# Patient Record
Sex: Female | Born: 1995
Health system: Southern US, Community
[De-identification: ages and names within clinical notes are randomized; demographics above are authoritative.]

## PROBLEM LIST (undated history)

## (undated) DIAGNOSIS — J020 Streptococcal pharyngitis: Secondary | ICD-10-CM

## (undated) DIAGNOSIS — F32A Depression, unspecified: Secondary | ICD-10-CM

## (undated) DIAGNOSIS — R600 Localized edema: Secondary | ICD-10-CM

## (undated) DIAGNOSIS — E669 Obesity, unspecified: Secondary | ICD-10-CM

## (undated) DIAGNOSIS — R1310 Dysphagia, unspecified: Secondary | ICD-10-CM

## (undated) DIAGNOSIS — E559 Vitamin D deficiency, unspecified: Secondary | ICD-10-CM

## (undated) DIAGNOSIS — K5900 Constipation, unspecified: Secondary | ICD-10-CM

## (undated) DIAGNOSIS — G4730 Sleep apnea, unspecified: Secondary | ICD-10-CM

## (undated) DIAGNOSIS — F419 Anxiety disorder, unspecified: Secondary | ICD-10-CM

## (undated) DIAGNOSIS — K589 Irritable bowel syndrome without diarrhea: Secondary | ICD-10-CM

## (undated) DIAGNOSIS — R0602 Shortness of breath: Secondary | ICD-10-CM

## (undated) DIAGNOSIS — R030 Elevated blood-pressure reading, without diagnosis of hypertension: Secondary | ICD-10-CM

## (undated) DIAGNOSIS — K219 Gastro-esophageal reflux disease without esophagitis: Secondary | ICD-10-CM

## (undated) HISTORY — DX: Localized edema: R60.0

## (undated) HISTORY — DX: Irritable bowel syndrome, unspecified: K58.9

## (undated) HISTORY — DX: Constipation, unspecified: K59.00

## (undated) HISTORY — DX: Dysphagia, unspecified: R13.10

## (undated) HISTORY — DX: Sleep apnea, unspecified: G47.30

## (undated) HISTORY — PX: UMBILICAL HERNIA REPAIR: SHX196

## (undated) HISTORY — DX: Shortness of breath: R06.02

## (undated) HISTORY — DX: Obesity, unspecified: E66.9

## (undated) HISTORY — DX: Gastro-esophageal reflux disease without esophagitis: K21.9

## (undated) HISTORY — DX: Depression, unspecified: F32.A

## (undated) HISTORY — DX: Elevated blood-pressure reading, without diagnosis of hypertension: R03.0

## (undated) HISTORY — DX: Anxiety disorder, unspecified: F41.9

## (undated) HISTORY — DX: Vitamin D deficiency, unspecified: E55.9

---

## 2016-12-17 DIAGNOSIS — Z01419 Encounter for gynecological examination (general) (routine) without abnormal findings: Secondary | ICD-10-CM | POA: Diagnosis not present

## 2016-12-17 DIAGNOSIS — Z113 Encounter for screening for infections with a predominantly sexual mode of transmission: Secondary | ICD-10-CM | POA: Diagnosis not present

## 2016-12-17 DIAGNOSIS — Z3202 Encounter for pregnancy test, result negative: Secondary | ICD-10-CM | POA: Diagnosis not present

## 2016-12-17 DIAGNOSIS — Z30017 Encounter for initial prescription of implantable subdermal contraceptive: Secondary | ICD-10-CM | POA: Diagnosis not present

## 2017-12-15 DIAGNOSIS — N760 Acute vaginitis: Secondary | ICD-10-CM | POA: Diagnosis not present

## 2017-12-15 DIAGNOSIS — Z113 Encounter for screening for infections with a predominantly sexual mode of transmission: Secondary | ICD-10-CM | POA: Diagnosis not present

## 2018-02-21 DIAGNOSIS — Z23 Encounter for immunization: Secondary | ICD-10-CM | POA: Diagnosis not present

## 2018-05-10 DIAGNOSIS — J019 Acute sinusitis, unspecified: Secondary | ICD-10-CM | POA: Diagnosis not present

## 2018-06-03 DIAGNOSIS — H6992 Unspecified Eustachian tube disorder, left ear: Secondary | ICD-10-CM | POA: Diagnosis not present

## 2019-06-22 ENCOUNTER — Other Ambulatory Visit: Payer: Self-pay

## 2019-06-22 DIAGNOSIS — Z20822 Contact with and (suspected) exposure to covid-19: Secondary | ICD-10-CM

## 2019-06-23 ENCOUNTER — Other Ambulatory Visit: Payer: Self-pay

## 2019-06-23 DIAGNOSIS — Z20822 Contact with and (suspected) exposure to covid-19: Secondary | ICD-10-CM

## 2019-06-24 LAB — NOVEL CORONAVIRUS, NAA
SARS-CoV-2, NAA: NOT DETECTED
SARS-CoV-2, NAA: NOT DETECTED

## 2019-07-10 ENCOUNTER — Other Ambulatory Visit: Payer: Self-pay

## 2019-07-10 DIAGNOSIS — Z20828 Contact with and (suspected) exposure to other viral communicable diseases: Secondary | ICD-10-CM | POA: Diagnosis not present

## 2019-07-10 DIAGNOSIS — Z20822 Contact with and (suspected) exposure to covid-19: Secondary | ICD-10-CM

## 2019-07-11 LAB — NOVEL CORONAVIRUS, NAA: SARS-CoV-2, NAA: NOT DETECTED

## 2019-07-20 ENCOUNTER — Other Ambulatory Visit: Payer: Self-pay

## 2019-07-20 DIAGNOSIS — Z20822 Contact with and (suspected) exposure to covid-19: Secondary | ICD-10-CM

## 2019-07-20 DIAGNOSIS — Z20828 Contact with and (suspected) exposure to other viral communicable diseases: Secondary | ICD-10-CM | POA: Diagnosis not present

## 2019-07-24 LAB — NOVEL CORONAVIRUS, NAA: SARS-CoV-2, NAA: DETECTED — AB

## 2019-07-29 DIAGNOSIS — U071 COVID-19: Secondary | ICD-10-CM | POA: Diagnosis not present

## 2019-07-29 DIAGNOSIS — Z20828 Contact with and (suspected) exposure to other viral communicable diseases: Secondary | ICD-10-CM | POA: Diagnosis not present

## 2019-07-31 DIAGNOSIS — Z20828 Contact with and (suspected) exposure to other viral communicable diseases: Secondary | ICD-10-CM | POA: Diagnosis not present

## 2019-08-16 ENCOUNTER — Other Ambulatory Visit: Payer: Self-pay

## 2019-08-16 ENCOUNTER — Ambulatory Visit (HOSPITAL_COMMUNITY)
Admission: EM | Admit: 2019-08-16 | Discharge: 2019-08-16 | Disposition: A | Discharge status: Home / Self Care | Payer: BLUE CROSS/BLUE SHIELD | Attending: Urgent Care | Admitting: Urgent Care

## 2019-08-16 ENCOUNTER — Encounter (HOSPITAL_COMMUNITY): Payer: Self-pay

## 2019-08-16 DIAGNOSIS — R0789 Other chest pain: Secondary | ICD-10-CM

## 2019-08-16 DIAGNOSIS — R0602 Shortness of breath: Secondary | ICD-10-CM | POA: Diagnosis not present

## 2019-08-16 DIAGNOSIS — R000 Tachycardia, unspecified: Secondary | ICD-10-CM | POA: Diagnosis not present

## 2019-08-16 DIAGNOSIS — Z8616 Personal history of COVID-19: Secondary | ICD-10-CM

## 2019-08-16 DIAGNOSIS — Z8619 Personal history of other infectious and parasitic diseases: Secondary | ICD-10-CM

## 2019-08-16 DIAGNOSIS — R059 Cough, unspecified: Secondary | ICD-10-CM

## 2019-08-16 DIAGNOSIS — R05 Cough: Secondary | ICD-10-CM

## 2019-08-16 LAB — D-DIMER, QUANTITATIVE: D-Dimer, Quant: 0.61 ug/mL-FEU — ABNORMAL HIGH (ref 0.00–0.50)

## 2019-08-16 MED ORDER — PROMETHAZINE-DM 6.25-15 MG/5ML PO SYRP: 5.0000 mL | ORAL_SOLUTION | Freq: Every evening | ORAL | 0 refills | Status: DC | PRN

## 2019-08-16 MED ORDER — BENZONATATE 100 MG PO CAPS: 100.0000 mg | ORAL_CAPSULE | Freq: Three times a day (TID) | ORAL | 0 refills | Status: DC | PRN

## 2019-08-16 MED ORDER — ALBUTEROL SULFATE HFA 108 (90 BASE) MCG/ACT IN AERS: 1.0000 | INHALATION_SPRAY | Freq: Four times a day (QID) | RESPIRATORY_TRACT | 0 refills | Status: DC | PRN

## 2019-08-16 NOTE — ED Provider Notes (Signed)
[place]  MRN: [id number] DOB: [birth year]  Subjective:   Rachel Dyer is a 23 y.o. female presenting for 1 week history of dyspnea, shortness of breath, heart racing.  Patient has also been having a productive cough, mild chest heaviness worse when she lies down.  She tested positive for COVID-19 on 07/21/2019 and has since completed her quarantine.  She is not currently taking any medications and has no known food or drug allergies.  Denies past medical and surgical history.   Family History  Problem Relation Age of Onset  . Healthy Mother   . Hypertension Father     Social History   Tobacco Use  . Smoking status: Never Smoker  . Smokeless tobacco: Never Used  Substance Use Topics  . Alcohol use: Never  . Drug use: Never    Review of Systems  Constitutional: Positive for malaise/fatigue. Negative for fever.  HENT: Negative for congestion, ear pain, sinus pain and sore throat.   Eyes: Negative for discharge and redness.  Respiratory: Positive for cough and shortness of breath. Negative for hemoptysis and wheezing.   Cardiovascular: Positive for palpitations. Negative for chest pain.  Gastrointestinal: Negative for abdominal pain, diarrhea, nausea and vomiting.  Genitourinary: Negative for dysuria, flank pain and hematuria.  Musculoskeletal: Negative for myalgias.  Skin: Negative for rash.  Neurological: Negative for dizziness, weakness and headaches.  Psychiatric/Behavioral: Negative for depression and substance abuse.     Objective:   Vitals: BP 128/83 (BP Location: Right Arm)   Pulse 100   Temp 98.5 F (36.9 C) (Oral)   Resp 16   LMP 08/01/2019   SpO2 100%   Physical Exam Constitutional:      General: She is not in acute distress.    Appearance: Normal appearance. She is well-developed. She is not ill-appearing, toxic-appearing or diaphoretic.  HENT:     Head: Normocephalic and atraumatic.     Nose: Nose normal.     Mouth/Throat:   Mouth: Mucous membranes are moist.  Eyes:     Extraocular Movements: Extraocular movements intact.     Pupils: Pupils are equal, round, and reactive to light.  Cardiovascular:     Rate and Rhythm: Normal rate and regular rhythm.     Pulses: Normal pulses.     Heart sounds: Normal heart sounds. No murmur. No friction rub. No gallop.   Pulmonary:     Effort: Pulmonary effort is normal. No respiratory distress.     Breath sounds: Normal breath sounds. No stridor. No wheezing, rhonchi or rales.  Skin:    General: Skin is warm and dry.     Findings: No rash.  Neurological:     Mental Status: She is alert and oriented to person, place, and time.     Cranial Nerves: No cranial nerve deficit.     Motor: No weakness.  Psychiatric:        Mood and Affect: Mood normal. Mood is not anxious.        Behavior: Behavior normal. Behavior is not agitated.        Thought Content: Thought content normal.     ED ECG REPORT   Date: 08/16/2019  Rate: 82bpm  Rhythm: normal sinus rhythm  QRS Axis: normal  Intervals: normal  ST/T Wave abnormalities: nonspecific T wave changes  Conduction Disutrbances:none  Narrative Interpretation: Sinus rhythm at 82bpm with T-wave inversion in V2-V3.  Old EKG Reviewed: none available  I have personally reviewed the EKG tracing and agree with the  computerized printout as noted.   Assessment and Plan :   1. Shortness of breath   2. History of 2019 novel coronavirus disease (COVID-19)   3. Racing heart beat   4. Chest discomfort   5. Cough     DDimer pending, will use supportive care for patient's shortness of breath. Offered symptomatic relief with albuterol inhaler, cough suppression medications. Counseled patient on potential for adverse effects with medications prescribed/recommended today, ER and return-to-clinic precautions discussed, patient verbalized understanding.     Wallis Bamberg, [place] 08/16/19 1921

## 2019-08-16 NOTE — ED Triage Notes (Signed)
Patient presents to Urgent Care with complaints SOB predominantly on exertion with subjective feeling of "speedy heart rate"of since 08/10/19. Coughing up sputum. Feeling of mild chest heaviness when lying down. Denies any fever, chills,  or abd pain, n/v/d.  No acute/obvious resp distress; regular resp rate/pattern.   Patient reports  Covid+  test 07/21/19 and completed 10 day quarantine.

## 2019-08-17 ENCOUNTER — Other Ambulatory Visit: Payer: Self-pay

## 2019-08-17 ENCOUNTER — Telehealth (HOSPITAL_COMMUNITY): Payer: Self-pay | Admitting: Emergency Medicine

## 2019-08-17 ENCOUNTER — Encounter (HOSPITAL_COMMUNITY): Payer: Self-pay | Admitting: *Deleted

## 2019-08-17 ENCOUNTER — Emergency Department (HOSPITAL_COMMUNITY): Payer: BLUE CROSS/BLUE SHIELD

## 2019-08-17 ENCOUNTER — Emergency Department (HOSPITAL_COMMUNITY)
Admission: EM | Admit: 2019-08-17 | Discharge: 2019-08-18 | Disposition: A | Discharge status: Home / Self Care | Payer: BLUE CROSS/BLUE SHIELD | Attending: Emergency Medicine | Admitting: Emergency Medicine

## 2019-08-17 DIAGNOSIS — R778 Other specified abnormalities of plasma proteins: Secondary | ICD-10-CM | POA: Diagnosis not present

## 2019-08-17 DIAGNOSIS — R0602 Shortness of breath: Secondary | ICD-10-CM | POA: Insufficient documentation

## 2019-08-17 DIAGNOSIS — U071 COVID-19: Secondary | ICD-10-CM | POA: Diagnosis not present

## 2019-08-17 DIAGNOSIS — R002 Palpitations: Secondary | ICD-10-CM | POA: Diagnosis not present

## 2019-08-17 DIAGNOSIS — R7989 Other specified abnormal findings of blood chemistry: Secondary | ICD-10-CM

## 2019-08-17 LAB — CBC WITH DIFFERENTIAL/PLATELET
Abs Immature Granulocytes: 0.02 10*3/uL (ref 0.00–0.07)
Basophils Absolute: 0 10*3/uL (ref 0.0–0.1)
Basophils Relative: 0 %
Eosinophils Absolute: 0.2 10*3/uL (ref 0.0–0.5)
Eosinophils Relative: 4 %
HCT: 36.4 % (ref 36.0–46.0)
Hemoglobin: 11.1 g/dL — ABNORMAL LOW (ref 12.0–15.0)
Immature Granulocytes: 0 %
Lymphocytes Relative: 47 %
Lymphs Abs: 2.9 10*3/uL (ref 0.7–4.0)
MCH: 25.5 pg — ABNORMAL LOW (ref 26.0–34.0)
MCHC: 30.5 g/dL (ref 30.0–36.0)
MCV: 83.5 fL (ref 80.0–100.0)
Monocytes Absolute: 0.4 10*3/uL (ref 0.1–1.0)
Monocytes Relative: 6 %
Neutro Abs: 2.7 10*3/uL (ref 1.7–7.7)
Neutrophils Relative %: 43 %
Platelets: 396 10*3/uL (ref 150–400)
RBC: 4.36 MIL/uL (ref 3.87–5.11)
RDW: 15.7 % — ABNORMAL HIGH (ref 11.5–15.5)
WBC: 6.2 10*3/uL (ref 4.0–10.5)
nRBC: 0 % (ref 0.0–0.2)

## 2019-08-17 LAB — I-STAT CHEM 8, ED
BUN: 8 mg/dL (ref 6–20)
Calcium, Ion: 1.22 mmol/L (ref 1.15–1.40)
Chloride: 103 mmol/L (ref 98–111)
Creatinine, Ser: 0.8 mg/dL (ref 0.44–1.00)
Glucose, Bld: 98 mg/dL (ref 70–99)
HCT: 36 % (ref 36.0–46.0)
Hemoglobin: 12.2 g/dL (ref 12.0–15.0)
Potassium: 3.8 mmol/L (ref 3.5–5.1)
Sodium: 139 mmol/L (ref 135–145)
TCO2: 27 mmol/L (ref 22–32)

## 2019-08-17 MED ORDER — IOHEXOL 350 MG/ML SOLN
100.0000 mL | Freq: Once | INTRAVENOUS | Status: AC | PRN
  Administered 2019-08-18: 100 mL via INTRAVENOUS

## 2019-08-17 NOTE — Telephone Encounter (Signed)
Reviewed labs with Dr. Meda Coffee, per Dr. Meda Coffee, safest for patient is to rule out blood clot in the emergency department. Pt contacted and made aware of results and need to be seen in the ER. Pt verbalized understanding, all questions answered.

## 2019-08-17 NOTE — ED Provider Notes (Signed)
[facility]-EMERGENCY DEPT Provider Note   CSN: [id number] Arrival date & time: 08/17/19  1817     History Chief Complaint  Patient presents with  . Abnormal Lab    Rachel Dyer is a 23 y.o. female.  The history is provided by the patient and medical records.  Abnormal Lab   23 year old female presenting to the ED after abnormal labs from urgent care.  She tested positive for COVID-19 on 07/21/2019 and completed her quarantine.  States she is feeling better from a infectious standpoint, still has some intermittent phlegm that she coughs up.  States her biggest issue is that she is continued to feel short of breath with some intermittent palpitations.  She was seen at urgent care yesterday for same and had an elevated D-dimer so sent to the ED today for further evaluation.  She has not had any ongoing fevers, hemoptysis, nausea, vomiting, or diarrhea.  She denies any history of DVT or PE in the past.  She is not currently on any exogenous estrogens.  No recent travel.    History reviewed. No pertinent past medical history.  There are no problems to display for this patient.   History reviewed. No pertinent surgical history.   OB History   No obstetric history on file.     Family History  Problem Relation Age of Onset  . Healthy Mother   . Hypertension Father     Social History   Tobacco Use  . Smoking status: Never Smoker  . Smokeless tobacco: Never Used  Substance Use Topics  . Alcohol use: Never  . Drug use: Never    Home Medications Prior to Admission medications   Medication Sig Start Date End Date Taking? Authorizing Provider  albuterol (VENTOLIN HFA) 108 (90 Base) MCG/ACT inhaler Inhale 1-2 puffs into the lungs every 6 (six) hours as needed for wheezing or shortness of breath. 08/16/19  Yes Wallis Bamberg, PA-C  benzonatate (TESSALON) 100 MG capsule Take 1-2 capsules (100-200 mg total) by mouth 3 (three) times daily as needed. 08/16/19   Yes Wallis Bamberg, PA-C  promethazine-dextromethorphan (PROMETHAZINE-DM) 6.25-15 MG/5ML syrup Take 5 mLs by mouth at bedtime as needed for cough. 08/16/19  Yes Wallis Bamberg, PA-C    Allergies    Patient has no known allergies.  Review of Systems   Review of Systems  Respiratory: Positive for shortness of breath.   Cardiovascular: Positive for palpitations.  All other systems reviewed and are negative.   Physical Exam Updated Vital Signs BP 121/82   Pulse 75   Temp 99.3 F (37.4 C) (Oral)   Resp 18   Ht 5\' 5"  (1.651 m)   Wt 90.7 kg   LMP 08/01/2019   SpO2 100%   BMI 33.28 kg/m   Physical Exam Vitals and nursing note reviewed.  Constitutional:      Appearance: She is well-developed.     Comments: Non-toxic, appears well  HENT:     Head: Normocephalic and atraumatic.  Eyes:     Conjunctiva/sclera: Conjunctivae normal.     Pupils: Pupils are equal, round, and reactive to light.  Cardiovascular:     Rate and Rhythm: Normal rate and regular rhythm.     Heart sounds: Normal heart sounds.     Comments: HR 80's during exam Pulmonary:     Effort: Pulmonary effort is normal. No respiratory distress.     Breath sounds: Normal breath sounds. No stridor. No wheezing or rhonchi.     Comments:  Lungs clear, no distress, able to speak in full sentences, O2 sats 100%, no cough observed Abdominal:     General: Bowel sounds are normal.     Palpations: Abdomen is soft.     Hernia: No hernia is present.  Musculoskeletal:        General: Normal range of motion.     Cervical back: Normal range of motion.  Skin:    General: Skin is warm and dry.  Neurological:     Mental Status: She is alert and oriented to person, place, and time.     ED Results / Procedures / Treatments   Labs (all labs ordered are listed, but only abnormal results are displayed) Labs Reviewed  CBC WITH DIFFERENTIAL/PLATELET - Abnormal; Notable for the following components:      Result Value   Hemoglobin 11.1  (*)    MCH 25.5 (*)    RDW 15.7 (*)    All other components within normal limits  I-STAT CHEM 8, ED  I-STAT BETA HCG BLOOD, ED ([facility], [facility], [facility] ONLY)    EKG None  Radiology CT Angio Chest PE W and/or Wo Contrast  Result Date: 08/18/2019 CLINICAL DATA:  COVID positive, short of breath positive D-dimer EXAM: CT ANGIOGRAPHY CHEST WITH CONTRAST TECHNIQUE: Multidetector CT imaging of the chest was performed using the standard protocol during bolus administration of intravenous contrast. Multiplanar CT image reconstructions and MIPs were obtained to evaluate the vascular anatomy. CONTRAST:  100mL OMNIPAQUE IOHEXOL 350 MG/ML SOLN COMPARISON:  None. FINDINGS: Cardiovascular: Satisfactory opacification of the pulmonary arteries to the segmental level. No evidence of pulmonary embolism. Normal heart size. No pericardial effusion. Mediastinum/Nodes: No enlarged mediastinal, hilar, or axillary lymph nodes. Thyroid gland, trachea, and esophagus demonstrate no significant findings. Lungs/Pleura: Lungs are clear. No pleural effusion or pneumothorax. Upper Abdomen: No acute abnormality. Musculoskeletal: No chest wall abnormality. No acute or significant osseous findings. Review of the MIP images confirms the above findings. IMPRESSION: Negative.  No CT evidence for acute pulmonary embolus. Electronically Signed   By: Jasmine PangKim  Fujinaga M.D.   On: 08/18/2019 00:30    Procedures Procedures (including critical care time)  Medications Ordered in ED Medications  sodium chloride (PF) 0.9 % injection (  Canceled Entry 08/18/19 0057)  iohexol (OMNIPAQUE) 350 MG/ML injection 100 mL (100 mLs Intravenous Contrast Given 08/18/19 0009)    ED Course  I have reviewed the triage vital signs and the nursing notes.  Pertinent labs & imaging results that were available during my care of the patient were reviewed by me and considered in my medical decision making (see chart for details).    MDM Rules/Calculators/A&P  [age] year old  female here after elevated D-dimer at urgent care.  She was seen there due to some ongoing shortness of breath and palpitations.  Was diagnosed with Covid on 07/21/2019 but seems to have recovered well.  On my exam she is afebrile and nontoxic.  Her vitals are stable without any tachycardia or hypoxia.  Her lung sounds are clear.  Given elevated D-dimer, CTA was obtained which is negative for PE or other acute findings.  Results discussed with patient and she feels reassured.  Appears stable for discharge.  She can continue to follow-up with her primary care doctor as needed.  She may return here for any new or acute changes.  Final Clinical Impression(s) / ED Diagnoses Final diagnoses:  Elevated d-dimer    Rx / DC Orders ED Discharge Orders    None  Larene Pickett, PA-C 08/18/19 0121    Lucrezia Starch, MD 08/18/19 [phone]

## 2019-08-17 NOTE — ED Triage Notes (Signed)
Pt states she went to UC last night due to some SOB on exertion. (+) Covid beginning of Dec and still feels effects of it. Slightly elevated  D Dimer last night and was told to come to ED for follow up.

## 2019-08-18 DIAGNOSIS — R0602 Shortness of breath: Secondary | ICD-10-CM | POA: Diagnosis not present

## 2019-08-18 DIAGNOSIS — U071 COVID-19: Secondary | ICD-10-CM | POA: Diagnosis not present

## 2019-08-18 DIAGNOSIS — R7989 Other specified abnormal findings of blood chemistry: Secondary | ICD-10-CM | POA: Diagnosis not present

## 2019-08-18 LAB — I-STAT BETA HCG BLOOD, ED (MC, WL, AP ONLY): I-stat hCG, quantitative: <5 m[IU]/mL (ref <5)

## 2019-08-18 MED ORDER — SODIUM CHLORIDE (PF) 0.9 % IJ SOLN
INTRAMUSCULAR | Status: AC
  Filled 2019-08-18: qty 50

## 2019-08-18 NOTE — Discharge Instructions (Signed)
CT did not show any signs of blood clot or any other acute findings in the lungs. Follow up with your primary care doctor as needed. Return here for any new/acute changes or concerns.

## 2019-09-01 ENCOUNTER — Encounter (HOSPITAL_COMMUNITY): Payer: Self-pay

## 2019-09-01 ENCOUNTER — Ambulatory Visit (HOSPITAL_COMMUNITY)
Admission: EM | Admit: 2019-09-01 | Discharge: 2019-09-01 | Disposition: A | Discharge status: Home / Self Care | Payer: BC Managed Care – PPO | Attending: Emergency Medicine | Admitting: Emergency Medicine

## 2019-09-01 ENCOUNTER — Other Ambulatory Visit: Payer: Self-pay

## 2019-09-01 DIAGNOSIS — J020 Streptococcal pharyngitis: Secondary | ICD-10-CM | POA: Diagnosis not present

## 2019-09-01 DIAGNOSIS — R000 Tachycardia, unspecified: Secondary | ICD-10-CM | POA: Diagnosis not present

## 2019-09-01 DIAGNOSIS — Z8616 Personal history of COVID-19: Secondary | ICD-10-CM

## 2019-09-01 LAB — POCT RAPID STREP A: Streptococcus, Group A Screen (Direct): POSITIVE — AB

## 2019-09-01 MED ORDER — AMOXICILLIN 500 MG PO CAPS: 500.0000 mg | ORAL_CAPSULE | Freq: Two times a day (BID) | ORAL | 0 refills | Status: AC

## 2019-09-01 NOTE — ED Triage Notes (Signed)
Pt presents to UC with sore throat x2 days.

## 2019-09-01 NOTE — ED Provider Notes (Signed)
[facility]    CSN: [id number] Arrival date & time: 09/01/19  1324      History   Chief Complaint Chief Complaint  Patient presents with  . Sore Throat    HPI Rachel Dyer is a 24 y.o. female.   Rachel Dyer presents with complaints of sore throat. Started yesterday. Pain with swallowing. Had covid in December. No known fevers. Some headache. No specific body aches. Nasal drainage. No cough. No known ill contacts. Took mucinex severe congestion, did seem to help. Noticed white spots to throat today. Denies any previous similar.    ROS per HPI, negative if not otherwise mentioned.      History reviewed. No pertinent past medical history.  There are no problems to display for this patient.   History reviewed. No pertinent surgical history.  OB History   No obstetric history on file.      Home Medications    Prior to Admission medications   Medication Sig Start Date End Date Taking? Authorizing Provider  albuterol (VENTOLIN HFA) 108 (90 Base) MCG/ACT inhaler Inhale 1-2 puffs into the lungs every 6 (six) hours as needed for wheezing or shortness of breath. 08/16/19   Wallis Bamberg, PA-C  amoxicillin (AMOXIL) 500 MG capsule Take 1 capsule (500 mg total) by mouth 2 (two) times daily for 10 days. 09/01/19 09/11/19  Georgetta Haber, NP  benzonatate (TESSALON) 100 MG capsule Take 1-2 capsules (100-200 mg total) by mouth 3 (three) times daily as needed. 08/16/19   Wallis Bamberg, PA-C  promethazine-dextromethorphan (PROMETHAZINE-DM) 6.25-15 MG/5ML syrup Take 5 mLs by mouth at bedtime as needed for cough. 08/16/19   Wallis Bamberg, PA-C    Family History Family History  Problem Relation Age of Onset  . Healthy Mother   . Hypertension Father     Social History Social History   Tobacco Use  . Smoking status: Never Smoker  . Smokeless tobacco: Never Used  Substance Use Topics  . Alcohol use: Never  . Drug use: Never     Allergies   Patient has no  known allergies.   Review of Systems Review of Systems   Physical Exam Triage Vital Signs ED Triage Vitals  Enc Vitals Group     BP 09/01/19 1424 (!) 135/92     Pulse Rate 09/01/19 1424 (!) 114     Resp 09/01/19 1424 17     Temp 09/01/19 1424 99.9 F (37.7 C)     Temp Source 09/01/19 1424 Oral     SpO2 09/01/19 1424 100 %     Weight --      Height --      Head Circumference --      Peak Flow --      Pain Score 09/01/19 1422 6     Pain Loc --      Pain Edu? --      Excl. in GC? --    No data found.  Updated Vital Signs BP (!) 135/92 (BP Location: Left Arm)   Pulse (!) 114   Temp 99.9 F (37.7 C) (Oral)   Resp 17   LMP  (Within Days) Comment: 4  SpO2 100%   Visual Acuity Right Eye Distance:   Left Eye Distance:   Bilateral Distance:    Right Eye Near:   Left Eye Near:    Bilateral Near:     Physical Exam Constitutional:      General: She is not in acute distress.  Appearance: She is well-developed.  HENT:     Mouth/Throat:     Pharynx: Posterior oropharyngeal erythema present.     Tonsils: Tonsillar exudate present. 2+ on the right. 2+ on the left.  Cardiovascular:     Rate and Rhythm: Tachycardia present.  Pulmonary:     Effort: Pulmonary effort is normal.  Skin:    General: Skin is warm and dry.  Neurological:     Mental Status: She is alert and oriented to person, place, and time.      UC Treatments / Results  Labs (all labs ordered are listed, but only abnormal results are displayed) Labs Reviewed  POCT RAPID STREP A - Abnormal; Notable for the following components:      Result Value   Streptococcus, Group A Screen (Direct) POSITIVE (*)    All other components within normal limits    EKG   Radiology No results found.  Procedures Procedures (including critical care time)  Medications Ordered in UC Medications - No data to display  Initial Impression / Assessment and Plan / UC Course  I have reviewed the triage vital signs  and the nursing notes.  Pertinent labs & imaging results that were available during my care of the patient were reviewed by me and considered in my medical decision making (see chart for details).     Positive rapid strep, consistent with h&P. Antibiotics provided. Return precautions provided. Patient verbalized understanding and agreeable to plan.   Final Clinical Impressions(s) / UC Diagnoses   Final diagnoses:  Strep pharyngitis     Discharge Instructions     Your rapid strep test is positive.  Complete course of antibiotics.  Throat lozenges, gargles, chloraseptic spray, warm teas, popsicles etc to help with throat pain.   If symptoms worsen or do not improve in the next week to return to be seen or to follow up with your PCP.   Change out toothbrush in 24 hours.     ED Prescriptions    Medication Sig Dispense Auth. Provider   amoxicillin (AMOXIL) 500 MG capsule Take 1 capsule (500 mg total) by mouth 2 (two) times daily for 10 days. 20 capsule Zigmund Gottron, NP     PDMP not reviewed this encounter.   Zigmund Gottron, NP 09/01/19 1510

## 2019-09-01 NOTE — Discharge Instructions (Addendum)
Your rapid strep test is positive.  Complete course of antibiotics.  Throat lozenges, gargles, chloraseptic spray, warm teas, popsicles etc to help with throat pain.   If symptoms worsen or do not improve in the next week to return to be seen or to follow up with your PCP.   Change out toothbrush in 24 hours.

## 2019-09-18 ENCOUNTER — Ambulatory Visit (HOSPITAL_COMMUNITY)
Admission: EM | Admit: 2019-09-18 | Discharge: 2019-09-18 | Disposition: A | Discharge status: Home / Self Care | Payer: BC Managed Care – PPO | Attending: Family Medicine | Admitting: Family Medicine

## 2019-09-18 ENCOUNTER — Other Ambulatory Visit: Payer: Self-pay

## 2019-09-18 ENCOUNTER — Encounter (HOSPITAL_COMMUNITY): Payer: Self-pay

## 2019-09-18 DIAGNOSIS — J029 Acute pharyngitis, unspecified: Secondary | ICD-10-CM | POA: Diagnosis not present

## 2019-09-18 DIAGNOSIS — J209 Acute bronchitis, unspecified: Secondary | ICD-10-CM | POA: Diagnosis not present

## 2019-09-18 HISTORY — DX: Streptococcal pharyngitis: J02.0

## 2019-09-18 LAB — POCT RAPID STREP A: Streptococcus, Group A Screen (Direct): NEGATIVE

## 2019-09-18 LAB — POCT INFECTIOUS MONO SCREEN: Mono Screen: NEGATIVE

## 2019-09-18 MED ORDER — DEXAMETHASONE SODIUM PHOSPHATE 10 MG/ML IJ SOLN
10.0000 mg | Freq: Once | INTRAMUSCULAR | Status: AC
  Administered 2019-09-18: 10 mg

## 2019-09-18 MED ORDER — DEXAMETHASONE SODIUM PHOSPHATE 10 MG/ML IJ SOLN
INTRAMUSCULAR | Status: AC
  Filled 2019-09-18: qty 1

## 2019-09-18 NOTE — Discharge Instructions (Addendum)
Your rapid mono and strep are both negative today.  Your exam still looks improved since last visit which is reassuring.  I would like to try some steroids today to see if that helps with swelling to help with your symptoms.  Push fluids to ensure adequate hydration and keep secretions thin.  Tylenol and/or ibuprofen as needed for pain or fevers.  Throat lozenges, gargles, chloraseptic spray, warm teas, popsicles etc to help with throat pain.   If worsening or not getting better in the next 3-4 days don't hesitate to return.

## 2019-09-18 NOTE — ED Provider Notes (Signed)
[facility]    CSN: [id number] Arrival date & time: 09/18/19  1022      History   Chief Complaint Chief Complaint  Patient presents with  . Sore Throat    HPI Rachel Dyer is a 24 y.o. female.   Rachel Dyer presents with complaints of  Sore throat. Positive for strep 1/15, finished antibiotics 1/25, symptoms had resolved. Symptoms returned 1/29.  Tonsils started swelling again, felt difficulty to breathe because of this as well as sensation of congestion. No fevers. No cough. Some runny nose. No ear pain. No headache. No body aches. Feels somewhat similar to strep before. No known ill contacts. Had changed toothbrush. No noticeable fatigue. No rash or gi symptoms. Prior to recent episode of strep no previous recurrent or previous tonsil issues.    ROS per HPI, negative if not otherwise mentioned.      Past Medical History:  Diagnosis Date  . Strep pharyngitis     There are no problems to display for this patient.   History reviewed. No pertinent surgical history.  OB History   No obstetric history on file.      Home Medications    Prior to Admission medications   Medication Sig Start Date End Date Taking? Authorizing Provider  albuterol (VENTOLIN HFA) 108 (90 Base) MCG/ACT inhaler Inhale 1-2 puffs into the lungs every 6 (six) hours as needed for wheezing or shortness of breath. 08/16/19  Yes Wallis Bamberg, PA-C  benzonatate (TESSALON) 100 MG capsule Take 1-2 capsules (100-200 mg total) by mouth 3 (three) times daily as needed. 08/16/19   Wallis Bamberg, PA-C  promethazine-dextromethorphan (PROMETHAZINE-DM) 6.25-15 MG/5ML syrup Take 5 mLs by mouth at bedtime as needed for cough. 08/16/19   Wallis Bamberg, PA-C    Family History Family History  Problem Relation Age of Onset  . Healthy Mother   . Hypertension Father     Social History Social History   Tobacco Use  . Smoking status: Never Smoker  . Smokeless tobacco: Never Used  Substance Use  Topics  . Alcohol use: Never  . Drug use: Never     Allergies   Patient has no known allergies.   Review of Systems Review of Systems   Physical Exam Triage Vital Signs ED Triage Vitals  Enc Vitals Group     BP 09/18/19 1103 (!) 145/96     Pulse Rate 09/18/19 1103 86     Resp 09/18/19 1103 18     Temp 09/18/19 1103 98.1 F (36.7 C)     Temp Source 09/18/19 1103 Oral     SpO2 09/18/19 1103 98 %     Weight --      Height --      Head Circumference --      Peak Flow --      Pain Score 09/18/19 1111 4     Pain Loc --      Pain Edu? --      Excl. in GC? --    No data found.  Updated Vital Signs BP (!) 145/96 (BP Location: Right Arm)   Pulse 86   Temp 98.1 F (36.7 C) (Oral)   Resp 18   LMP 08/24/2019   SpO2 98%    Physical Exam Constitutional:      General: She is not in acute distress.    Appearance: She is well-developed.  HENT:     Mouth/Throat:     Pharynx: No oropharyngeal exudate or uvula swelling.  Tonsils: No tonsillar exudate. 2+ on the right. 1+ on the left.     Comments: Mild swelling to tonsils noted without significant redness, no exudate; swallowing without difficulty; speaking clearly Cardiovascular:     Rate and Rhythm: Normal rate.  Pulmonary:     Effort: Pulmonary effort is normal.  Lymphadenopathy:     Cervical: No cervical adenopathy.  Skin:    General: Skin is warm and dry.  Neurological:     Mental Status: She is alert and oriented to person, place, and time.      UC Treatments / Results  Labs (all labs ordered are listed, but only abnormal results are displayed) Labs Reviewed  CULTURE, GROUP A STREP [facility])  POCT INFECTIOUS MONO SCREEN  POCT RAPID STREP A  POCT INFECTIOUS MONO SCREEN    EKG   Radiology No results found.  Procedures Procedures (including critical care time)  Medications Ordered in UC Medications  dexamethasone (DECADRON) injection 10 mg (10 mg Other Given 09/18/19 1154)    Initial Impression  / Assessment and Plan / UC Course  I have reviewed the triage vital signs and the nursing notes.  Pertinent labs & imaging results that were available during my care of the patient were reviewed by me and considered in my medical decision making (see chart for details).     Negative repeat strep and negative mono. Patient had covid in December. No known new exposures. New viral illness considered at this time. Decadron today for swelling. Return precautions provided. Patient verbalized understanding and agreeable to plan.   Final Clinical Impressions(s) / UC Diagnoses   Final diagnoses:  Pharyngitis, unspecified etiology     Discharge Instructions     Your rapid mono and strep are both negative today.  Your exam still looks improved since last visit which is reassuring.  I would like to try some steroids today to see if that helps with swelling to help with your symptoms.  Push fluids to ensure adequate hydration and keep secretions thin.  Tylenol and/or ibuprofen as needed for pain or fevers.  Throat lozenges, gargles, chloraseptic spray, warm teas, popsicles etc to help with throat pain.   If worsening or not getting better in the next 3-4 days don't hesitate to return.     ED Prescriptions    None     PDMP not reviewed this encounter.   Zigmund Gottron, NP 09/18/19 1302

## 2019-09-18 NOTE — ED Triage Notes (Signed)
C/o sore throat since last week, difficulty swallowing since last week. Completed ABX for strep throat that she was dx with a couple weeks ago. Denies fever, chills, back pain, dysuria sx.  Tonsils enlarged.

## 2019-09-20 LAB — CULTURE, GROUP A STREP (THRC)

## 2019-09-21 ENCOUNTER — Telehealth (HOSPITAL_COMMUNITY): Payer: Self-pay | Admitting: Emergency Medicine

## 2019-09-21 NOTE — Telephone Encounter (Signed)
Culture shows not group A. Pt recently treated for strep several weeks ago. Contacted pt by phone, she states her sore throat is improving. No treatment indicated at this time, pt will follow up if symptoms persist or get worse. All questions answered.

## 2019-11-27 ENCOUNTER — Encounter (HOSPITAL_COMMUNITY): Payer: Self-pay

## 2019-11-27 ENCOUNTER — Other Ambulatory Visit: Payer: Self-pay

## 2019-11-27 ENCOUNTER — Ambulatory Visit (HOSPITAL_COMMUNITY)
Admission: EM | Admit: 2019-11-27 | Discharge: 2019-11-27 | Disposition: A | Discharge status: Home / Self Care | Payer: BC Managed Care – PPO | Attending: Family Medicine | Admitting: Family Medicine

## 2019-11-27 DIAGNOSIS — B9689 Other specified bacterial agents as the cause of diseases classified elsewhere: Secondary | ICD-10-CM | POA: Diagnosis not present

## 2019-11-27 DIAGNOSIS — N898 Other specified noninflammatory disorders of vagina: Secondary | ICD-10-CM

## 2019-11-27 DIAGNOSIS — A5909 Other urogenital trichomoniasis: Secondary | ICD-10-CM | POA: Diagnosis not present

## 2019-11-27 DIAGNOSIS — N760 Acute vaginitis: Secondary | ICD-10-CM | POA: Insufficient documentation

## 2019-11-27 DIAGNOSIS — N949 Unspecified condition associated with female genital organs and menstrual cycle: Secondary | ICD-10-CM | POA: Diagnosis not present

## 2019-11-27 DIAGNOSIS — B373 Candidiasis of vulva and vagina: Secondary | ICD-10-CM | POA: Diagnosis not present

## 2019-11-27 MED ORDER — VALACYCLOVIR HCL 1 G PO TABS: 1000.0000 mg | ORAL_TABLET | Freq: Three times a day (TID) | ORAL | 0 refills | Status: AC

## 2019-11-27 MED ORDER — DOXYCYCLINE HYCLATE 100 MG PO CAPS: 100.0000 mg | ORAL_CAPSULE | Freq: Two times a day (BID) | ORAL | 0 refills | Status: AC

## 2019-11-27 MED ORDER — CEFTRIAXONE SODIUM 500 MG IJ SOLR
500.0000 mg | Freq: Once | INTRAMUSCULAR | Status: AC
  Administered 2019-11-27: 500 mg via INTRAMUSCULAR

## 2019-11-27 MED ORDER — CEFTRIAXONE SODIUM 500 MG IJ SOLR
INTRAMUSCULAR | Status: AC
  Filled 2019-11-27: qty 500

## 2019-11-27 MED ORDER — LIDOCAINE HCL (PF) 1 % IJ SOLN
INTRAMUSCULAR | Status: AC
  Filled 2019-11-27: qty 2

## 2019-11-27 NOTE — ED Provider Notes (Signed)
[facility]    CSN: [id number] Arrival date & time: 11/27/19  1143      History   Chief Complaint Chief Complaint  Patient presents with  . Vaginal Discharge  . Mass    HPI Rachel Dyer is a 24 y.o. female no significant past medical history presenting today for evaluation of vaginal discharge and genital lesion.  Patient notes that over the past week she has had increased vaginal discharge than normal.  She denies associating itching or irritation.  She has had a small bump near her perineum which initially was painful, but this has improved.  Denies history of similar.  Denies multiple lesions.  Last menstrual cycle was towards the end of March.  Is not on birth control.  Reports using protection with sexual partners.  HPI  Past Medical History:  Diagnosis Date  . Strep pharyngitis     There are no problems to display for this patient.   History reviewed. No pertinent surgical history.  OB History   No obstetric history on file.      Home Medications    Prior to Admission medications   Medication Sig Start Date End Date Taking? Authorizing Provider  albuterol (VENTOLIN HFA) 108 (90 Base) MCG/ACT inhaler Inhale 1-2 puffs into the lungs every 6 (six) hours as needed for wheezing or shortness of breath. 08/16/19   Wallis Bamberg, PA-C  benzonatate (TESSALON) 100 MG capsule Take 1-2 capsules (100-200 mg total) by mouth 3 (three) times daily as needed. 08/16/19   Wallis Bamberg, PA-C  doxycycline (VIBRAMYCIN) 100 MG capsule Take 1 capsule (100 mg total) by mouth 2 (two) times daily for 7 days. 11/27/19 12/04/19  Lenzie Montesano C, PA-C  promethazine-dextromethorphan (PROMETHAZINE-DM) 6.25-15 MG/5ML syrup Take 5 mLs by mouth at bedtime as needed for cough. 08/16/19   Wallis Bamberg, PA-C  valACYclovir (VALTREX) 1000 MG tablet Take 1 tablet (1,000 mg total) by mouth 3 (three) times daily for 14 days. 11/27/19 12/11/19  Zyquan Crotty, Junius Creamer, PA-C    Family History Family  History  Problem Relation Age of Onset  . Healthy Mother   . Hypertension Father     Social History Social History   Tobacco Use  . Smoking status: Never Smoker  . Smokeless tobacco: Never Used  Substance Use Topics  . Alcohol use: Never  . Drug use: Never     Allergies   Patient has no known allergies.   Review of Systems Review of Systems  Constitutional: Negative for fever.  Respiratory: Negative for shortness of breath.   Cardiovascular: Negative for chest pain.  Gastrointestinal: Negative for abdominal pain, diarrhea, nausea and vomiting.  Genitourinary: Positive for genital sores and vaginal discharge. Negative for dysuria, flank pain, hematuria, menstrual problem, vaginal bleeding and vaginal pain.  Musculoskeletal: Negative for back pain.  Skin: Negative for rash.  Neurological: Negative for dizziness, light-headedness and headaches.     Physical Exam Triage Vital Signs ED Triage Vitals  Enc Vitals Group     BP 11/27/19 1248 132/85     Pulse Rate 11/27/19 1248 (!) 114     Resp 11/27/19 1248 17     Temp 11/27/19 1248 99.2 F (37.3 C)     Temp Source 11/27/19 1248 Oral     SpO2 11/27/19 1248 100 %     Weight --      Height --      Head Circumference --      Peak Flow --  Pain Score 11/27/19 1246 0     Pain Loc --      Pain Edu? --      Excl. in [place]? --    No data found.  Updated Vital Signs BP 132/85 (BP Location: Left Arm)   Pulse (!) 114   Temp 99.2 F (37.3 C) (Oral)   Resp 17   LMP 11/11/2019 (Exact Date)   SpO2 100%   Visual Acuity Right Eye Distance:   Left Eye Distance:   Bilateral Distance:    Right Eye Near:   Left Eye Near:    Bilateral Near:     Physical Exam Vitals and nursing note reviewed.  Constitutional:      Appearance: She is well-developed.     Comments: No acute distress  HENT:     Head: Normocephalic and atraumatic.     Nose: Nose normal.  Eyes:     Conjunctiva/sclera: Conjunctivae normal.    Cardiovascular:     Rate and Rhythm: Normal rate.  Pulmonary:     Effort: Pulmonary effort is normal. No respiratory distress.  Abdominal:     General: There is no distension.     Comments: Soft, nondistended, nontender light deep palpation throughout abdomen  Genitourinary:    Comments: Ulcerative yellowish appearing lesion noted to vulva just anterior to perineum, labia minora with small clustered lesions noted as well, mucosal surfaces and vaginal mucosa appears erythematous, cervix is erythematous with significant amount of yellowish-greenish watery discharge Musculoskeletal:        General: Normal range of motion.     Cervical back: Neck supple.  Skin:    General: Skin is warm and dry.  Neurological:     Mental Status: She is alert and oriented to person, place, and time.      UC Treatments / Results  Labs (all labs ordered are listed, but only abnormal results are displayed) Labs Reviewed  HSV CULTURE AND TYPING  CERVICOVAGINAL ANCILLARY ONLY    EKG   Radiology No results found.  Procedures Procedures (including critical care time)  Medications Ordered in UC Medications  cefTRIAXone (ROCEPHIN) injection 500 mg (500 mg Intramuscular Given 11/27/19 1343)    Initial Impression / Assessment and Plan / UC Course  I have reviewed the triage vital signs and the nursing notes.  Pertinent labs & imaging results that were available during my care of the patient were reviewed by me and considered in my medical decision making (see chart for details).     Exam concerning for HSV, empirically treating with Valtrex beginning today.  Will empirically treat for gonorrhea and chlamydia today also based off pelvic exam, vaginal swab pending to screen for STDs.  Will provide further treatment as needed.  Recommended to follow-up with OB/GYN for further management/treatment of HSV as well as have Pap smear updated.  Discussed strict return precautions. Patient verbalized  understanding and is agreeable with plan.  Final Clinical Impressions(s) / UC Diagnoses   Final diagnoses:  Vaginal discharge  Genital lesion, female     Discharge Instructions     We have treated you today for gonorrhea, with rocephin. Please continue with doxyxycline twice daily for 1 week to cover chalmydia. Please refrain from sexual intercourse for 7 days while medicines eliminating infection.   Begin valtrex 3 times daily to treat ulcer as it is concerning for herpes/HSV. Read attached Please follow up with OBGYN for further management of this and have pap smear updated- contact below  We are testing  you for Gonorrhea, Chlamydia, Trichomonas, Yeast and Bacterial Vaginosis. We will call you if anything is positive and let you know if you require any further treatment. Please inform partners of any positive results.   Please return if symptoms not improving with treatment, development of fever, nausea, vomiting, abdominal pain.    ED Prescriptions    Medication Sig Dispense Auth. Provider   valACYclovir (VALTREX) 1000 MG tablet Take 1 tablet (1,000 mg total) by mouth 3 (three) times daily for 14 days. 42 tablet Aletheia Tangredi C, PA-C   doxycycline (VIBRAMYCIN) 100 MG capsule Take 1 capsule (100 mg total) by mouth 2 (two) times daily for 7 days. 14 capsule Tee Richeson, Frederickson C, PA-C     PDMP not reviewed this encounter.   Lew Dawes, [place] 11/27/19 1403

## 2019-11-27 NOTE — ED Triage Notes (Signed)
Reports vaginal discharge for one week as well as a small bump in the vaginal area. Reports pain earlier in the week, but not in pain now. Reports she uses protection with sexual partners.

## 2019-11-27 NOTE — Discharge Instructions (Addendum)
We have treated you today for gonorrhea, with rocephin. Please continue with doxyxycline twice daily for 1 week to cover chalmydia. Please refrain from sexual intercourse for 7 days while medicines eliminating infection.   Begin valtrex 3 times daily to treat ulcer as it is concerning for herpes/HSV. Read attached Please follow up with OBGYN for further management of this and have pap smear updated- contact below  We are testing you for Gonorrhea, Chlamydia, Trichomonas, Yeast and Bacterial Vaginosis. We will call you if anything is positive and let you know if you require any further treatment. Please inform partners of any positive results.   Please return if symptoms not improving with treatment, development of fever, nausea, vomiting, abdominal pain.

## 2019-11-28 ENCOUNTER — Telehealth: Payer: Self-pay

## 2019-11-28 LAB — CERVICOVAGINAL ANCILLARY ONLY
Bacterial Vaginitis (gardnerella): POSITIVE — AB
Candida Glabrata: NEGATIVE
Candida Vaginitis: POSITIVE — AB
Chlamydia: NEGATIVE
Comment: NEGATIVE
Comment: NEGATIVE
Comment: NEGATIVE
Comment: NEGATIVE
Comment: NEGATIVE
Comment: NORMAL
Neisseria Gonorrhea: NEGATIVE
Trichomonas: POSITIVE — AB

## 2019-11-28 MED ORDER — FLUCONAZOLE 200 MG PO TABS: 200.0000 mg | ORAL_TABLET | Freq: Every day | ORAL | 0 refills | Status: AC

## 2019-11-28 MED ORDER — METRONIDAZOLE 500 MG PO TABS: 500.0000 mg | ORAL_TABLET | Freq: Two times a day (BID) | ORAL | 0 refills | Status: DC

## 2019-11-30 LAB — HSV CULTURE AND TYPING

## 2019-12-25 DIAGNOSIS — I10 Essential (primary) hypertension: Secondary | ICD-10-CM | POA: Diagnosis not present

## 2019-12-25 DIAGNOSIS — Z6835 Body mass index (BMI) 35.0-35.9, adult: Secondary | ICD-10-CM | POA: Diagnosis not present

## 2019-12-25 DIAGNOSIS — E6609 Other obesity due to excess calories: Secondary | ICD-10-CM | POA: Diagnosis not present

## 2019-12-25 DIAGNOSIS — A6000 Herpesviral infection of urogenital system, unspecified: Secondary | ICD-10-CM | POA: Diagnosis not present

## 2020-01-03 ENCOUNTER — Other Ambulatory Visit (HOSPITAL_COMMUNITY)
Admission: RE | Admit: 2020-01-03 | Discharge: 2020-01-03 | Disposition: A | Discharge status: Home / Self Care | Payer: Medicaid Other | Source: Ambulatory Visit | Attending: Physician Assistant | Admitting: Physician Assistant

## 2020-01-03 DIAGNOSIS — Z6836 Body mass index (BMI) 36.0-36.9, adult: Secondary | ICD-10-CM | POA: Diagnosis not present

## 2020-01-03 DIAGNOSIS — E6609 Other obesity due to excess calories: Secondary | ICD-10-CM | POA: Diagnosis not present

## 2020-01-03 DIAGNOSIS — D649 Anemia, unspecified: Secondary | ICD-10-CM | POA: Diagnosis not present

## 2020-01-03 DIAGNOSIS — Z124 Encounter for screening for malignant neoplasm of cervix: Secondary | ICD-10-CM | POA: Insufficient documentation

## 2020-01-03 DIAGNOSIS — I10 Essential (primary) hypertension: Secondary | ICD-10-CM | POA: Diagnosis not present

## 2020-01-04 DIAGNOSIS — R87612 Low grade squamous intraepithelial lesion on cytologic smear of cervix (LGSIL): Secondary | ICD-10-CM | POA: Diagnosis not present

## 2020-01-09 LAB — CYTOLOGY - PAP
Comment: NEGATIVE
Comment: NEGATIVE
High risk HPV: POSITIVE — AB
Trichomonas: NEGATIVE

## 2020-02-06 DIAGNOSIS — Z111 Encounter for screening for respiratory tuberculosis: Secondary | ICD-10-CM | POA: Diagnosis not present

## 2020-02-08 DIAGNOSIS — Z111 Encounter for screening for respiratory tuberculosis: Secondary | ICD-10-CM | POA: Diagnosis not present

## 2020-05-03 DIAGNOSIS — Z03818 Encounter for observation for suspected exposure to other biological agents ruled out: Secondary | ICD-10-CM | POA: Diagnosis not present

## 2020-05-03 DIAGNOSIS — Z20822 Contact with and (suspected) exposure to covid-19: Secondary | ICD-10-CM | POA: Diagnosis not present

## 2020-06-07 DIAGNOSIS — Z23 Encounter for immunization: Secondary | ICD-10-CM | POA: Diagnosis not present

## 2020-06-07 DIAGNOSIS — R202 Paresthesia of skin: Secondary | ICD-10-CM | POA: Diagnosis not present

## 2020-07-03 DIAGNOSIS — I10 Essential (primary) hypertension: Secondary | ICD-10-CM | POA: Diagnosis not present

## 2020-07-03 DIAGNOSIS — D509 Iron deficiency anemia, unspecified: Secondary | ICD-10-CM | POA: Diagnosis not present

## 2020-08-12 DIAGNOSIS — B349 Viral infection, unspecified: Secondary | ICD-10-CM | POA: Diagnosis not present

## 2020-08-12 DIAGNOSIS — Z03818 Encounter for observation for suspected exposure to other biological agents ruled out: Secondary | ICD-10-CM | POA: Diagnosis not present

## 2020-08-12 DIAGNOSIS — J069 Acute upper respiratory infection, unspecified: Secondary | ICD-10-CM | POA: Diagnosis not present

## 2020-09-17 DIAGNOSIS — I10 Essential (primary) hypertension: Secondary | ICD-10-CM | POA: Diagnosis not present

## 2020-10-22 ENCOUNTER — Other Ambulatory Visit: Payer: Self-pay

## 2020-10-22 ENCOUNTER — Ambulatory Visit (INDEPENDENT_AMBULATORY_CARE_PROVIDER_SITE_OTHER): Payer: 59 | Admitting: Bariatrics

## 2020-10-22 ENCOUNTER — Encounter (INDEPENDENT_AMBULATORY_CARE_PROVIDER_SITE_OTHER): Payer: Self-pay | Admitting: Bariatrics

## 2020-10-22 VITALS — BP 123/86 | HR 80 | Temp 98.0°F | Ht 65.0 in | Wt 233.0 lb

## 2020-10-22 DIAGNOSIS — R7309 Other abnormal glucose: Secondary | ICD-10-CM

## 2020-10-22 DIAGNOSIS — F509 Eating disorder, unspecified: Secondary | ICD-10-CM

## 2020-10-22 DIAGNOSIS — Z9189 Other specified personal risk factors, not elsewhere classified: Secondary | ICD-10-CM | POA: Diagnosis not present

## 2020-10-22 DIAGNOSIS — E559 Vitamin D deficiency, unspecified: Secondary | ICD-10-CM | POA: Diagnosis not present

## 2020-10-22 DIAGNOSIS — R5383 Other fatigue: Secondary | ICD-10-CM

## 2020-10-22 DIAGNOSIS — Z6838 Body mass index (BMI) 38.0-38.9, adult: Secondary | ICD-10-CM

## 2020-10-22 DIAGNOSIS — Z0289 Encounter for other administrative examinations: Secondary | ICD-10-CM

## 2020-10-22 DIAGNOSIS — I10 Essential (primary) hypertension: Secondary | ICD-10-CM | POA: Diagnosis not present

## 2020-10-22 DIAGNOSIS — R0602 Shortness of breath: Secondary | ICD-10-CM | POA: Diagnosis not present

## 2020-10-22 DIAGNOSIS — Z1331 Encounter for screening for depression: Secondary | ICD-10-CM

## 2020-10-22 DIAGNOSIS — D509 Iron deficiency anemia, unspecified: Secondary | ICD-10-CM | POA: Insufficient documentation

## 2020-10-22 DIAGNOSIS — E538 Deficiency of other specified B group vitamins: Secondary | ICD-10-CM

## 2020-10-23 ENCOUNTER — Encounter (INDEPENDENT_AMBULATORY_CARE_PROVIDER_SITE_OTHER): Payer: Self-pay | Admitting: Bariatrics

## 2020-10-23 DIAGNOSIS — E559 Vitamin D deficiency, unspecified: Secondary | ICD-10-CM | POA: Insufficient documentation

## 2020-10-23 LAB — COMPREHENSIVE METABOLIC PANEL
ALT: 10 IU/L (ref 0–32)
AST: 15 IU/L (ref 0–40)
Albumin/Globulin Ratio: 1.3 (ref 1.2–2.2)
Albumin: 4 g/dL (ref 3.9–5.0)
Alkaline Phosphatase: 76 IU/L (ref 44–121)
BUN/Creatinine Ratio: 9 (ref 9–23)
BUN: 7 mg/dL (ref 6–20)
Bilirubin Total: 0.2 mg/dL (ref 0.0–1.2)
CO2: 20 mmol/L (ref 20–29)
Calcium: 9.1 mg/dL (ref 8.7–10.2)
Chloride: 103 mmol/L (ref 96–106)
Creatinine, Ser: 0.77 mg/dL (ref 0.57–1.00)
Globulin, Total: 3.1 g/dL (ref 1.5–4.5)
Glucose: 85 mg/dL (ref 65–99)
Potassium: 4.2 mmol/L (ref 3.5–5.2)
Sodium: 140 mmol/L (ref 134–144)
Total Protein: 7.1 g/dL (ref 6.0–8.5)
eGFR: 111 mL/min/{1.73_m2} (ref >59)

## 2020-10-23 LAB — INSULIN, RANDOM: INSULIN: 12 u[IU]/mL (ref 2.6–24.9)

## 2020-10-23 LAB — CBC WITH DIFFERENTIAL/PLATELET
Basophils Absolute: 0 10*3/uL (ref 0.0–0.2)
Basos: 1 %
EOS (ABSOLUTE): 0.2 10*3/uL (ref 0.0–0.4)
Eos: 4 %
Hematocrit: 41.3 % (ref 34.0–46.6)
Hemoglobin: 13 g/dL (ref 11.1–15.9)
Immature Grans (Abs): 0 10*3/uL (ref 0.0–0.1)
Immature Granulocytes: 0 %
Lymphocytes Absolute: 1.7 10*3/uL (ref 0.7–3.1)
Lymphs: 35 %
MCH: 26.8 pg (ref 26.6–33.0)
MCHC: 31.5 g/dL (ref 31.5–35.7)
MCV: 85 fL (ref 79–97)
Monocytes Absolute: 0.3 10*3/uL (ref 0.1–0.9)
Monocytes: 7 %
Neutrophils Absolute: 2.6 10*3/uL (ref 1.4–7.0)
Neutrophils: 53 %
Platelets: 348 10*3/uL (ref 150–450)
RBC: 4.85 x10E6/uL (ref 3.77–5.28)
RDW: 14.5 % (ref 11.7–15.4)
WBC: 4.9 10*3/uL (ref 3.4–10.8)

## 2020-10-23 LAB — HEMOGLOBIN A1C
Est. average glucose Bld gHb Est-mCnc: 111 mg/dL
Hgb A1c MFr Bld: 5.5 % (ref 4.8–5.6)

## 2020-10-23 LAB — LIPID PANEL WITH LDL/HDL RATIO
Cholesterol, Total: 166 mg/dL (ref 100–199)
HDL: 43 mg/dL (ref >39)
LDL Chol Calc (NIH): 103 mg/dL — ABNORMAL HIGH (ref 0–99)
LDL/HDL Ratio: 2.4 ratio (ref 0.0–3.2)
Triglycerides: 108 mg/dL (ref 0–149)
VLDL Cholesterol Cal: 20 mg/dL (ref 5–40)

## 2020-10-23 LAB — T3: T3, Total: 135 ng/dL (ref 71–180)

## 2020-10-23 LAB — TSH: TSH: 3.11 u[IU]/mL (ref 0.450–4.500)

## 2020-10-23 LAB — VITAMIN B12: Vitamin B-12: 536 pg/mL (ref 232–1245)

## 2020-10-23 LAB — VITAMIN D 25 HYDROXY (VIT D DEFICIENCY, FRACTURES): Vit D, 25-Hydroxy: 5.9 ng/mL — ABNORMAL LOW (ref 30.0–100.0)

## 2020-10-23 LAB — T4, FREE: Free T4: 1.12 ng/dL (ref 0.82–1.77)

## 2020-10-23 NOTE — Progress Notes (Signed)
Dear Joselyn Arrow, PA,   Thank you for referring Rachel Dyer to our clinic. The following note includes my evaluation and treatment recommendations.  Chief Complaint:   OBESITY LESLIE JESTER (MR# [id number]) is a 25 y.o. female who presents for evaluation and treatment of obesity and related comorbidities. Current BMI is Body mass index is 38.77 kg/m. Virginia has been struggling with her weight for many years and has been unsuccessful in either losing weight, maintaining weight loss, or reaching her healthy weight goal.  Iverna is currently in the action stage of change and ready to dedicate time achieving and maintaining a healthier weight. Taniyah is interested in becoming our patient and working on intensive lifestyle modifications including (but not limited to) diet and exercise for weight loss.  Carlene does like to cook when she has time. She craves fries and sweets. She dislikes eggs. She skips breakfast.  Destany's habits were reviewed today and are as follows: she has been heavy most of her life, she started gaining weight last year 2021, her heaviest weight ever was 280 pounds, she has significant food cravings issues, she skips meals frequently, she is frequently drinking liquids with calories, she frequently makes poor food choices, she has problems with excessive hunger, she frequently eats larger portions than normal and she struggles with emotional eating.  Depression Screen Brynlyn's Food and Mood (modified PHQ-9) score was 10.  Depression screen PHQ 2/9 10/22/2020  Decreased Interest 1  Down, Depressed, Hopeless 2  PHQ - 2 Score 3  Altered sleeping 2  Tired, decreased energy 2  Change in appetite 2  Feeling bad or failure about yourself  1  Trouble concentrating 0  Moving slowly or fidgety/restless 0  Suicidal thoughts 0  PHQ-9 Score 10  Difficult doing work/chores Somewhat difficult   Subjective:   1. Other fatigue Tilia admits to daytime somnolence  and admits to waking up still tired. Patent has a history of symptoms of none. Gwendolyn generally gets 4-7 hours of sleep per night, and states that she has generally restful sleep. Snoring is present. Apneic episodes are not present. Epworth Sleepiness Score is 1.  2. SOB (shortness of breath) on exertion Aashna notes increasing shortness of breath with exercising and seems to be worsening over time with weight gain. She notes getting out of breath sooner with activity than she used to. This has gotten worse recently. Kanyia denies shortness of breath at rest or orthopnea.  3. Essential hypertension Jania is not on medication. She has a paternal family history of hypertension.  BP Readings from Last 3 Encounters:  10/22/20 123/86  11/27/19 132/85  09/18/19 (!) 145/96    4. Vitamin D deficiency Alexandria is not on Vit D supplementation.  5. Elevated glucose Kenzi has a family history of elevated glucose.  6. Vitamin B 12 deficiency Abagail is not taking a B12 supplement.  7. Eating disorder, unspecified type Queen has a PHQ-9 score of 10.  8. At risk for activity intolerance Arya is at risk for exercise intolerance due to fatigue, SOB, and obesity.  Assessment/Plan:   1. Other fatigue Kenecia does feel that her weight is causing her energy to be lower than it should be. Fatigue may be related to obesity, depression or many other causes. Labs will be ordered, and in the meanwhile, Sanyia will focus on self care including making healthy food choices, increasing physical activity and focusing on stress reduction. Check labs today.  - EKG 12-Lead - CBC  with Differential/Platelet - Insulin, random - Lipid Panel With LDL/HDL Ratio - T3 - T4, free - TSH - VITAMIN D 25 Hydroxy (Vit-D Deficiency, Fractures) - TSH+T4F+T3Free  2. SOB (shortness of breath) on exertion Alexandra does feel that she gets out of breath more easily that she used to when she exercises. Liany's shortness of  breath appears to be obesity related and exercise induced. She has agreed to work on weight loss and gradually increase exercise to treat her exercise induced shortness of breath. Will continue to monitor closely.  3. Essential hypertension Marykathryn is working on healthy weight loss and exercise to improve blood pressure control. We will watch for signs of hypotension as she continues her lifestyle modifications. Check labs today.  - Comprehensive metabolic panel  4. Vitamin D deficiency Low Vitamin D level contributes to fatigue and are associated with obesity, breast, and colon cancer. She agrees to follow-up for routine testing of Vitamin D, at least 2-3 times per year to avoid over-replacement. Check labs today.  - VITAMIN D 25 Hydroxy (Vit-D Deficiency, Fractures)  5. Elevated glucose Fasting labs will be obtained and results with be discussed with Casy in 2 weeks at her follow up visit. In the meanwhile Saramarie was started on a lower simple carbohydrate diet and will work on weight loss efforts.  - Hemoglobin A1c - Insulin, random  6. Vitamin B 12 deficiency The diagnosis was reviewed with the patient. Counseling provided today, see below. We will continue to monitor. Orders and follow up as documented in patient record. Check labs today.  Counseling . The body needs vitamin B12: to make red blood cells; to make DNA; and to help the nerves work properly so they can carry messages from the brain to the body.  . The main causes of vitamin B12 deficiency include dietary deficiency, digestive diseases, pernicious anemia, and having a surgery in which part of the stomach or small intestine is removed.  . Certain medicines can make it harder for the body to absorb vitamin B12. These medicines include: heartburn medications; some antibiotics; some medications used to treat diabetes, gout, and high cholesterol.  . In some cases, there are no symptoms of this condition. If the condition leads to  anemia or nerve damage, various symptoms can occur, such as weakness or fatigue, shortness of breath, and numbness or tingling in your hands and feet.   . Treatment:  o May include taking vitamin B12 supplements.  o Avoid alcohol.  o Eat lots of healthy foods that contain vitamin B12: - Beef, pork, chicken, [place], and organ meats, such as liver.  - Seafood: This includes clams, rainbow trout, salmon, tuna, and haddock. Eggs.  - Cereal and dairy products that are fortified: This means that vitamin B12 has been added to the food.   - Vitamin B12  7. Eating disorder, unspecified type Refer to Dr. Dewaine Conger.  8. Depression screen Prosperity had a positive depression screening. Depression is commonly associated with obesity and often results in emotional eating behaviors. We will monitor this closely and work on CBT to help improve the non-hunger eating patterns. Referral to Psychology may be required if no improvement is seen as she continues in our clinic.  9. At risk for activity intolerance Yunique was given approximately 15 minutes of exercise intolerance counseling today. She is 25 y.o. female and has risk factors exercise intolerance including obesity. We discussed intensive lifestyle modifications today with an emphasis on specific weight loss instructions and strategies. Kelle will slowly  increase activity as tolerated.  Repetitive spaced learning was employed today to elicit superior memory formation and behavioral change.  10. Class 2 severe obesity due to excess calories with serious comorbidity and body mass index (BMI) of 38.0 to 38.9 in adult [facility]) Wealthy is currently in the action stage of change and her goal is to continue with weight loss efforts. I recommend Rayvin begin the structured treatment plan as follows:  She has agreed to the Category 3 Plan.  Exercise goals: No exercise has been prescribed at this time.   Behavioral modification strategies: increasing lean protein  intake, decreasing simple carbohydrates, increasing vegetables, increasing water intake, decreasing eating out, no skipping meals, meal planning and cooking strategies, keeping healthy foods in the home and planning for success.  She was informed of the importance of frequent follow-up visits to maximize her success with intensive lifestyle modifications for her multiple health conditions. She was informed we would discuss her lab results at her next visit unless there is a critical issue that needs to be addressed sooner. Spirit agreed to keep her next visit at the agreed upon time to discuss these results.  Objective:   Blood pressure 123/86, pulse 80, temperature 98 F (36.7 C), height 5\' 5"  (1.651 m), weight 233 lb (105.7 kg), last menstrual period 09/23/2020, SpO2 99 %. Body mass index is 38.77 kg/m.  EKG: Normal sinus rhythm, rate 80.  Indirect Calorimeter completed today shows a VO2 of 280 and a REE of 1948.  Her calculated basal metabolic rate is 11/21/2020 thus her basal metabolic rate is better than expected.  General: Cooperative, alert, well developed, in no acute distress. HEENT: Conjunctivae and lids unremarkable. Cardiovascular: Regular rhythm.  Lungs: Normal work of breathing. Neurologic: No focal deficits.   Lab Results  Component Value Date   CREATININE 0.77 10/22/2020   BUN 7 10/22/2020   NA 140 10/22/2020   K 4.2 10/22/2020   CL 103 10/22/2020   CO2 20 10/22/2020   Lab Results  Component Value Date   ALT 10 10/22/2020   AST 15 10/22/2020   ALKPHOS 76 10/22/2020   BILITOT 0.2 10/22/2020   Lab Results  Component Value Date   HGBA1C 5.5 10/22/2020   Lab Results  Component Value Date   INSULIN 12.0 10/22/2020   Lab Results  Component Value Date   TSH 3.110 10/22/2020   Lab Results  Component Value Date   CHOL 166 10/22/2020   HDL 43 10/22/2020   LDLCALC 103 (H) 10/22/2020   TRIG 108 10/22/2020   Lab Results  Component Value Date   WBC 4.9 10/22/2020    HGB 13.0 10/22/2020   HCT 41.3 10/22/2020   MCV 85 10/22/2020   PLT 348 10/22/2020    Attestation Statements:   Reviewed by clinician on day of visit: allergies, medications, problem list, medical history, surgical history, family history, social history, and previous encounter notes.  12/22/2020, am acting as Edmund Hilda for Energy manager, DO.  I have reviewed the above documentation for accuracy and completeness, and I agree with the above. [employer], DO

## 2020-10-24 ENCOUNTER — Encounter (INDEPENDENT_AMBULATORY_CARE_PROVIDER_SITE_OTHER): Payer: Self-pay | Admitting: Bariatrics

## 2020-10-24 NOTE — Progress Notes (Unsigned)
Office: [phone]  /  Fax: [phone]    Date: November 07, 2020   Appointment Start Time: *** Duration: *** minutes Provider: Lawerance Cruel, Psy.D. Type of Session: Intake for Individual Therapy  Location of Patient: {gbptloc:23249} Location of Provider: Provider's Home (private office) Type of Contact: Telepsychological Visit via MyChart Video Visit  Informed Consent: This provider called Rachel Dyer at 9:02am as she did not present for the telepsychological appointment. A HIPAA compliant voicemail was left requesting a call back. As such, today's appointment was initiated *** minutes late. Prior to proceeding with today's appointment, two pieces of identifying information were obtained. In addition, Rachel Dyer's physical location at the time of this appointment was obtained as well a phone number she could be reached at in the event of technical difficulties. Rachel Dyer and this provider participated in today's telepsychological service.   The provider's role was explained to Rachel Dyer. The provider reviewed and discussed issues of confidentiality, privacy, and limits therein (e.g., reporting obligations). In addition to verbal informed consent, written informed consent for psychological services was obtained prior to the initial appointment. Since the clinic is not a 24/7 crisis center, mental health emergency resources were shared and this  provider explained MyChart, e-mail, voicemail, and/or other messaging systems should be utilized only for non-emergency reasons. This provider also explained that information obtained during appointments will be placed in Rachel Dyer's medical record and relevant information will be shared with other providers at [facility] for coordination of care. Rachel Dyer agreed information may be shared with other [facility] providers as needed for coordination of care and by signing the service agreement document, she provided written consent for  coordination of care. Prior to initiating telepsychological services, Rachel Dyer completed an informed consent document, which included the development of a safety plan (i.e., an emergency contact and emergency resources) in the event of an emergency/crisis. Rachel Dyer expressed understanding of the rationale of the safety plan. Rachel Dyer verbally acknowledged understanding she is ultimately responsible for understanding her insurance benefits for telepsychological and in-person services. This provider also reviewed confidentiality, as it relates to telepsychological services, as well as the rationale for telepsychological services (i.e., to reduce exposure risk to COVID-19). Rachel Dyer  acknowledged understanding that appointments cannot be recorded without both party consent and she is aware she is responsible for securing confidentiality on her end of the session. Rachel Dyer verbally consented to proceed.  Chief Complaint/HPI: Rachel Dyer was referred by Dr. Corinna Capra due to Eating disorder, unspecified type. Per the note for the initial visit with Dr. Corinna Capra on October 22, 2020, "Rachel Dyer has a PHQ-9 score of 10." The note for the initial appointment with Dr. Corinna Capra indicated the following: "Rachel Dyer's habits were reviewed today and are as follows: she has been heavy most of her life, she started gaining weight last year 2021, her heaviest weight ever was 280 pounds, she has significant food cravings issues, she skips meals frequently, she is frequently drinking liquids with calories, she frequently makes poor food choices, she has problems with excessive hunger, she frequently eats larger portions than normal and she struggles with emotional eating." Rachel Dyer's Food and Mood (modified PHQ-9) score on October 22, 2020 was 10.  During today's appointment, Rachel Dyer was verbally administered a questionnaire assessing various behaviors related to emotional eating. Rachel Dyer endorsed the following: {gbmoodandfood:21755}. She shared she  craves ***. Rachel Dyer believes the onset of emotional eating was *** and described the current frequency of emotional eating as ***. In addition, Rachel Dyer {gblegal:22371} a  history of binge eating. *** Currently, Rachel Dyer indicated *** triggers emotional eating, whereas *** makes emotional eating better. Furthermore, Rachel Dyer {gblegal:22371} other problems of concern. ***   Mental Status Examination:  Appearance: {Appearance:22431} Behavior: {Behavior:22445} Mood: {gbmood:21757} Affect: {Affect:22436} Speech: {Speech:22432} Eye Contact: {Eye Contact:22433} Psychomotor Activity: {Motor Activity:22434} Gait: {gbgait:23404} Thought Process: {thought process:22448}  Thought Content/Perception: {disturbances:22451} Orientation: {Orientation:22437} Memory/Concentration: {gbcognition:22449} Insight/Judgment: {Insight:22446}  Family & Psychosocial History: Rachel Dyer reported she is *** and ***. She indicated she is currently ***. Additionally, Rachel Dyer shared her highest level of education obtained is ***. Currently, Rachel Dyer's social support system consists of her ***. Moreover, Rachel Dyer stated she resides with her ***.   Medical History:  Past Medical History:  Diagnosis Date  . Anxiety   . Borderline hypertension   . Constipation   . Depression   . Edema of both lower legs   . GERD (gastroesophageal reflux disease)   . IBS (irritable bowel syndrome)   . Obesity   . Sleep apnea   . SOB (shortness of breath)   . Strep pharyngitis   . Swallowing difficulty   . Vitamin D deficiency    Past Surgical History:  Procedure Laterality Date  . UMBILICAL HERNIA REPAIR     Current Outpatient Medications on File Prior to Visit  Medication Sig Dispense Refill  . buPROPion (WELLBUTRIN SR) 150 MG 12 hr tablet Take 1 tablet (150 mg total) by mouth 2 (two) times daily. 30 tablet 0  . ferrous sulfate 325 (65 FE) MG EC tablet Take 325 mg by mouth 3 (three) times daily with meals.    . valACYclovir (VALTREX) 500 MG  tablet Take 500 mg by mouth. 1 tab po prn    . Vitamin D, Ergocalciferol, (DRISDOL) 1.25 MG (50000 UNIT) CAPS capsule Take 1 capsule (50,000 Units total) by mouth every 7 (seven) days. 4 capsule 0   No current facility-administered medications on file prior to visit.    Mental Health History: Wanetta reported ***. Azoria {Endorse or deny of item:23407} hospitalizations for psychiatric concerns. Kamry {gblegal:22371} a family history of mental health related concerns. *** Naziya {Endorse or deny of item:23407} trauma including {gbtrauma:22071} abuse, as well as neglect. ***  Mayci described her typical mood lately as ***. Aside from concerns noted above and endorsed on the PHQ-9 and GAD-7, Kadia reported ***. Aleni {gblegal:22371} current alcohol use. *** She {gblegal:22371} tobacco use. *** She {gblegal:22371} illicit/recreational substance use. Regarding caffeine intake, Kymiah reported ***. Furthermore, Aylanie indicated she is not experiencing the following: {gbsxs:21965}. She also denied history of and current suicidal ideation, plan, and intent; history of and current homicidal ideation, plan, and intent; and history of and current engagement in self-harm.  The following strengths were reported by Kailie: ***. The following strengths were observed by this provider: ability to express thoughts and feelings during the therapeutic session, ability to establish and benefit from a therapeutic relationship, willingness to work toward established goal(s) with the clinic and ability to engage in reciprocal conversation. ***  Legal History: Braya {Endorse or deny of item:23407} legal involvement.   Structured Assessments Results: The Patient Health Questionnaire-9 (PHQ-9) is a self-report measure that assesses symptoms and severity of depression over the course of the last two weeks. Tanairi obtained a score of *** suggesting {GBPHQ9SEVERITY:21752}. Annagrace finds the endorsed symptoms to be  {gbphq9difficulty:21754}. [0= Not at all; 1= Several days; 2= More than half the days; 3= Nearly every day] Little interest or pleasure in doing things ***  Feeling down, depressed, or  hopeless ***  Trouble falling or staying asleep, or sleeping too much ***  Feeling tired or having little energy ***  Poor appetite or overeating ***  Feeling bad about yourself --- or that you are a failure or have let yourself or your family down ***  Trouble concentrating on things, such as reading the newspaper or watching television ***  Moving or speaking so slowly that other people could have noticed? Or the opposite --- being so fidgety or restless that you have been moving around a lot more than usual ***  Thoughts that you would be better off dead or hurting yourself in some way ***  PHQ-9 Score ***    The Generalized Anxiety Disorder-7 (GAD-7) is a brief self-report measure that assesses symptoms of anxiety over the course of the last two weeks. Shineka obtained a score of *** suggesting {gbgad7severity:21753}. Sherby finds the endorsed symptoms to be {gbphq9difficulty:21754}. [0= Not at all; 1= Several days; 2= Over half the days; 3= Nearly every day] Feeling nervous, anxious, on edge ***  Not being able to stop or control worrying ***  Worrying too much about different things ***  Trouble relaxing ***  Being so restless that it's hard to sit still ***  Becoming easily annoyed or irritable ***  Feeling afraid as if something awful might happen ***  GAD-7 Score ***   Interventions:  {Interventions List for Intake:23406}  Provisional DSM-5 Diagnosis(es): {Diagnoses:22752}  Plan: Zabdi appears able and willing to participate as evidenced by collaboration on a treatment goal, engagement in reciprocal conversation, and asking questions as needed for clarification. The next appointment will be scheduled in {gbweeks:21758}, which will be {gbtxmodality:23402}. The following treatment goal was  established: {gbtxgoals:21759}. This provider will regularly review the treatment plan and medical chart to keep informed of status changes. Sarinah expressed understanding and agreement with the initial treatment plan of care. *** Renate will be sent a handout via e-mail to utilize between now and the next appointment to increase awareness of hunger patterns and subsequent eating. Marylen provided verbal consent during today's appointment for this provider to send the handout via e-mail. ***

## 2020-11-05 ENCOUNTER — Ambulatory Visit (INDEPENDENT_AMBULATORY_CARE_PROVIDER_SITE_OTHER): Payer: 59 | Admitting: Bariatrics

## 2020-11-05 ENCOUNTER — Other Ambulatory Visit: Payer: Self-pay

## 2020-11-05 ENCOUNTER — Encounter (INDEPENDENT_AMBULATORY_CARE_PROVIDER_SITE_OTHER): Payer: Self-pay | Admitting: Bariatrics

## 2020-11-05 VITALS — BP 125/87 | HR 81 | Temp 97.9°F | Ht 65.0 in | Wt 233.0 lb

## 2020-11-05 DIAGNOSIS — E559 Vitamin D deficiency, unspecified: Secondary | ICD-10-CM | POA: Diagnosis not present

## 2020-11-05 DIAGNOSIS — E6609 Other obesity due to excess calories: Secondary | ICD-10-CM | POA: Diagnosis not present

## 2020-11-05 DIAGNOSIS — Z6838 Body mass index (BMI) 38.0-38.9, adult: Secondary | ICD-10-CM

## 2020-11-05 DIAGNOSIS — Z9189 Other specified personal risk factors, not elsewhere classified: Secondary | ICD-10-CM

## 2020-11-05 DIAGNOSIS — F509 Eating disorder, unspecified: Secondary | ICD-10-CM | POA: Diagnosis not present

## 2020-11-05 DIAGNOSIS — E8881 Metabolic syndrome: Secondary | ICD-10-CM | POA: Diagnosis not present

## 2020-11-05 MED ORDER — VITAMIN D (ERGOCALCIFEROL) 1.25 MG (50000 UNIT) PO CAPS: 50000.0000 [IU] | ORAL_CAPSULE | ORAL | 0 refills | Status: AC

## 2020-11-05 MED ORDER — BUPROPION HCL ER (SR) 150 MG PO TB12: 150.0000 mg | ORAL_TABLET | Freq: Two times a day (BID) | ORAL | 0 refills | Status: AC

## 2020-11-06 ENCOUNTER — Encounter (INDEPENDENT_AMBULATORY_CARE_PROVIDER_SITE_OTHER): Payer: Self-pay | Admitting: Bariatrics

## 2020-11-06 NOTE — Progress Notes (Signed)
Chief Complaint:   OBESITY Rachel Dyer is here to discuss her progress with her obesity treatment plan along with follow-up of her obesity related diagnoses. Rachel Dyer is on the Category 3 Plan and states she is following her eating plan approximately 40% of the time. [name] states she is doing 0 minutes 0 times per week.  Today's visit was #: 2 Starting weight: 233 lbs Starting date: 10/22/2020 Today's weight: 233 lbs Today's date: 11/05/2020 Total lbs lost to date: 0 Total lbs lost since last in-office visit: 0  Interim History: Dula's weight remains the same. She states that she got her cycle and she retains water and has increased cravings. She did take her lunch.  Subjective:   1. Insulin resistance [name]'s insulin level is 12.0 and A1c 5.5.  Lab Results  Component Value Date   INSULIN 12.0 10/22/2020   Lab Results  Component Value Date   HGBA1C 5.5 10/22/2020   2. Vitamin D deficiency Rachel Dyer's Vit D level is 5.9. She is not on Vit D supplementation.   Ref. Range 10/22/2020 08:29  Vitamin D, 25-Hydroxy Latest Ref Range: 30.0 - 100.0 ng/mL 5.9 (L)   3. Eating disorder, unspecified type [name] reports increased cravings with her cycle.   Assessment/Plan:   1. Insulin resistance [name] will continue to work on weight loss, exercise, and decreasing simple carbohydrates to help decrease the risk of diabetes. [name] agreed to follow-up with [place] as directed to closely monitor her progress. Handout for IR.  2. Vitamin D deficiency Low Vitamin D level contributes to fatigue and are associated with obesity, breast, and colon cancer. She agrees to start to take prescription Vitamin D @50 ,000 IU every week and will follow-up for routine testing of Vitamin D, at least 2-3 times per year to avoid over-replacement.  - Vitamin D, Ergocalciferol, (DRISDOL) 1.25 MG (50000 UNIT) CAPS capsule; Take 1 capsule (50,000 Units total) by mouth every 7 (seven) days.  Dispense: 4 capsule; Refill:  0  3. Eating disorder, unspecified type Behavior modification techniques were discussed today to help Rachel Dyer deal with her emotional/non-hunger eating behaviors.  Orders and follow up as documented in patient record.   - buPROPion (WELLBUTRIN SR) 150 MG 12 hr tablet; Take 1 tablet (150 mg total) by mouth 2 (two) times daily.  Dispense: 30 tablet; Refill: 0   5. Class 2 obesity due to excess calories without serious comorbidity with body mass index (BMI) of 38.0 to 38.9 in adult Rachel Dyer is currently in the action stage of change. As such, her goal is to continue with weight loss efforts. She has agreed to the Category 3 Plan.   10/22/2020 labs reviewed with pt.  Exercise goals: Rachel Dyer will start exercising.  Behavioral modification strategies: increasing lean protein intake, decreasing simple carbohydrates, increasing vegetables, increasing water intake, decreasing eating out, no skipping meals, meal planning and cooking strategies, keeping healthy foods in the home and planning for success.  Rachel Dyer has agreed to follow-up with our clinic in 2 weeks. She was informed of the importance of frequent follow-up visits to maximize her success with intensive lifestyle modifications for her multiple health conditions.   Objective:   Blood pressure 125/87, pulse 81, temperature 97.9 F (36.6 C), height 5\' 5"  (1.651 m), weight 233 lb (105.7 kg), SpO2 100 %. Body mass index is 38.77 kg/m.  General: Cooperative, alert, well developed, in no acute distress. HEENT: Conjunctivae and lids unremarkable. Cardiovascular: Regular rhythm.  Lungs: Normal work of breathing. Neurologic: No focal deficits.  Lab Results  Component Value Date   CREATININE 0.77 10/22/2020   BUN 7 10/22/2020   NA 140 10/22/2020   K 4.2 10/22/2020   CL 103 10/22/2020   CO2 20 10/22/2020   Lab Results  Component Value Date   ALT 10 10/22/2020   AST 15 10/22/2020   ALKPHOS 76 10/22/2020   BILITOT 0.2 10/22/2020   Lab  Results  Component Value Date   HGBA1C 5.5 10/22/2020   Lab Results  Component Value Date   INSULIN 12.0 10/22/2020   Lab Results  Component Value Date   TSH 3.110 10/22/2020   Lab Results  Component Value Date   CHOL 166 10/22/2020   HDL 43 10/22/2020   LDLCALC 103 (H) 10/22/2020   TRIG 108 10/22/2020   Lab Results  Component Value Date   WBC 4.9 10/22/2020   HGB 13.0 10/22/2020   HCT 41.3 10/22/2020   MCV 85 10/22/2020   PLT 348 10/22/2020    Attestation Statements:   Reviewed by clinician on day of visit: allergies, medications, problem list, medical history, surgical history, family history, social history, and previous encounter notes.  Edmund Hilda, am acting as Energy manager for [employer], DO.  I have reviewed the above documentation for accuracy and completeness, and I agree with the above. Corinna Capra, DO

## 2020-11-07 ENCOUNTER — Telehealth (INDEPENDENT_AMBULATORY_CARE_PROVIDER_SITE_OTHER): Payer: Self-pay | Admitting: Psychology

## 2020-11-07 ENCOUNTER — Telehealth (INDEPENDENT_AMBULATORY_CARE_PROVIDER_SITE_OTHER): Payer: 59 | Admitting: Psychology

## 2020-11-07 ENCOUNTER — Encounter (INDEPENDENT_AMBULATORY_CARE_PROVIDER_SITE_OTHER): Payer: Self-pay

## 2020-11-07 NOTE — Telephone Encounter (Signed)
  Office: [phone]  /  Fax: [phone]  Date of Call: November 07, 2020 Time of Call: 9:02am Provider: Lawerance Cruel, PsyD  CONTENT: This provider called Debbera to check-in as she did not present for today's MyChart Video Visit appointment at 9:00am. A HIPAA compliant voicemail was left requesting a call back. Of note, this provider stayed on the MyChart Video Visit appointment for 5 minutes prior to signing off per the clinic's grace period policy.    PLAN: This provider will wait for Masen to call back. No further follow-up planned by this provider.

## 2020-11-19 ENCOUNTER — Encounter (INDEPENDENT_AMBULATORY_CARE_PROVIDER_SITE_OTHER): Payer: Self-pay

## 2020-11-19 ENCOUNTER — Ambulatory Visit (INDEPENDENT_AMBULATORY_CARE_PROVIDER_SITE_OTHER): Payer: 59 | Admitting: Bariatrics

## 2021-08-25 IMAGING — CT CT ANGIO CHEST
2 of 6 series · 18 of 36 positions shown · IV contrast (OMNIPAQUE 350)
Comparison: None.

CLINICAL DATA: COVID positive, short of breath positive D-dimer

EXAM:
CT ANGIOGRAPHY CHEST WITH CONTRAST
TECHNIQUE: Multidetector CT imaging of the chest was performed using the
standard protocol during bolus administration of intravenous
contrast. Multiplanar CT image reconstructions and MIPs were
obtained to evaluate the vascular anatomy.
CONTRAST:  100mL OMNIPAQUE IOHEXOL 350 MG/ML SOLN

[Series 6: thins · axial · 0.63mm/px · z∈[-290,-95]mm · 17 of 221 slices shown]
[im 13/221  lung]
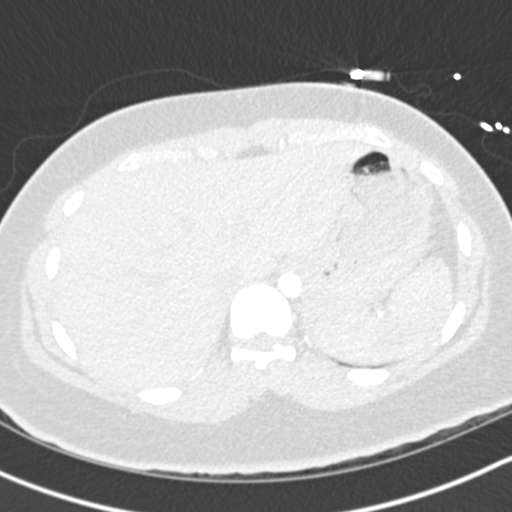
[im 25/221  mediastinal]
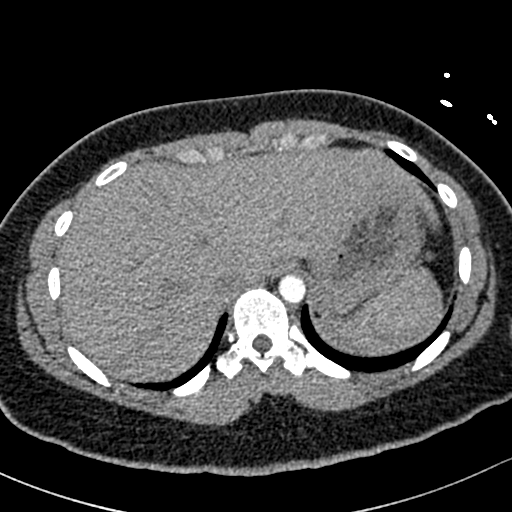
[im 37/221  lung]
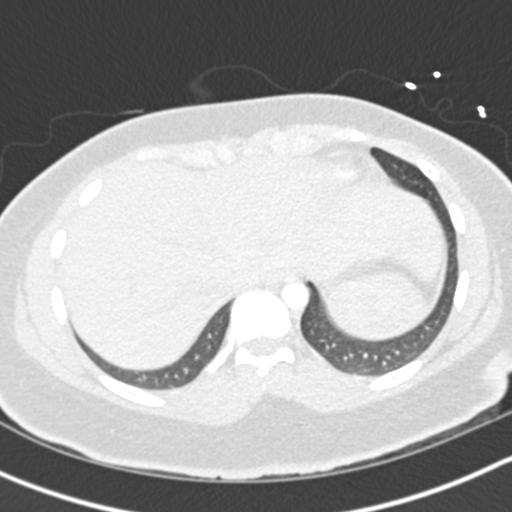
[im 49/221  mediastinal]
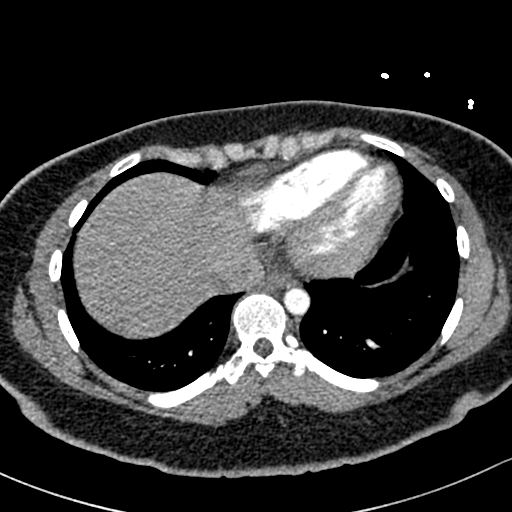
[im 62/221  lung]
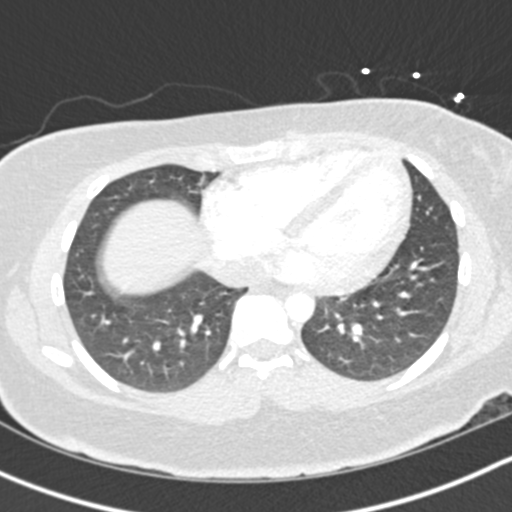
[im 74/221  mediastinal]
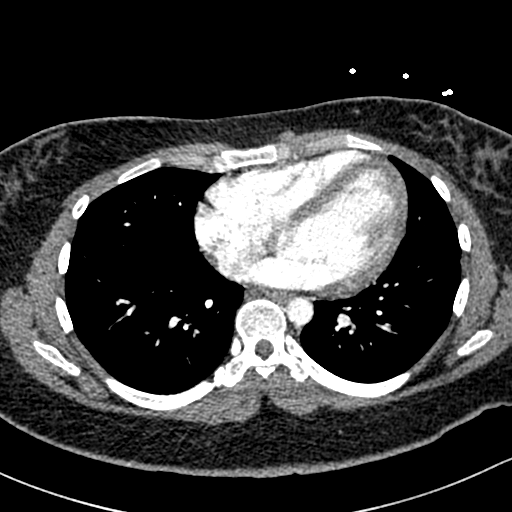
[im 86/221  lung]
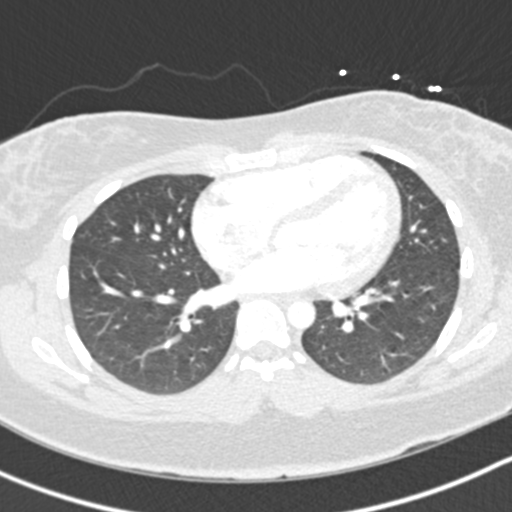
[im 98/221  mediastinal]
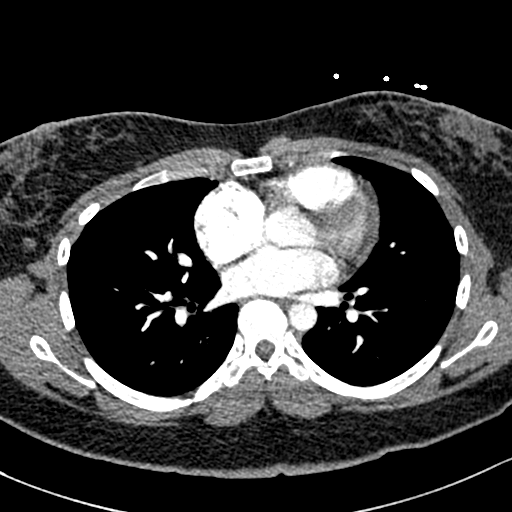
[im 111/221  lung]
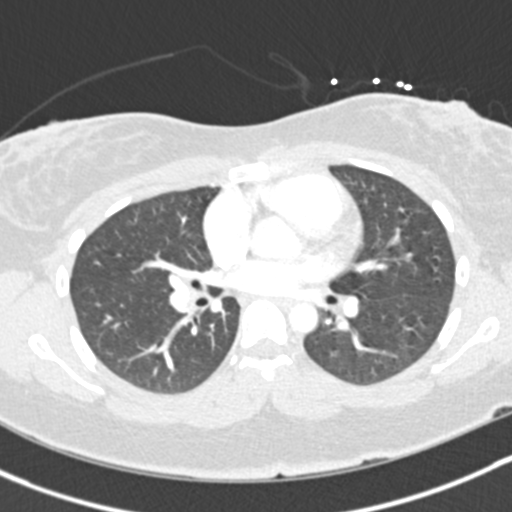
[im 123/221  mediastinal]
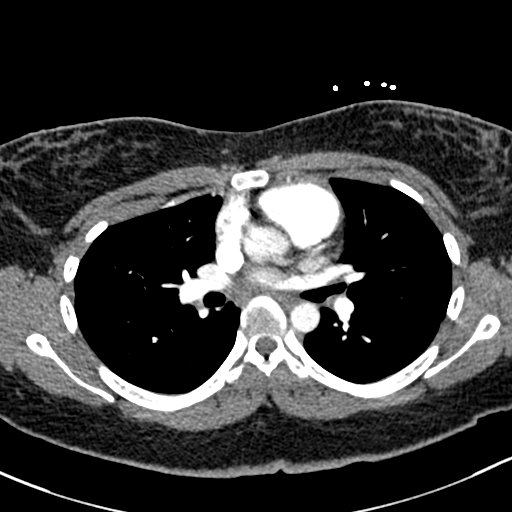
[im 135/221  lung]
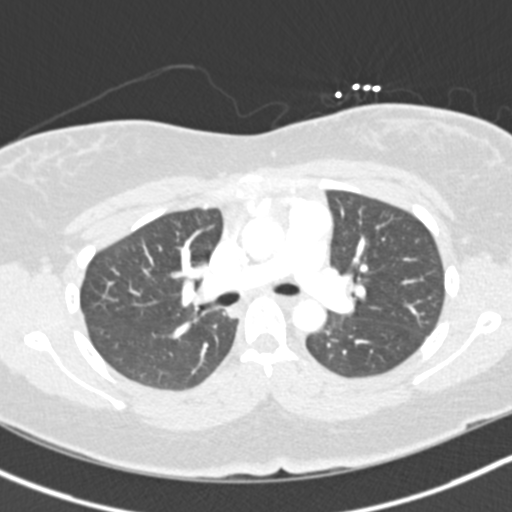
[im 147/221  mediastinal]
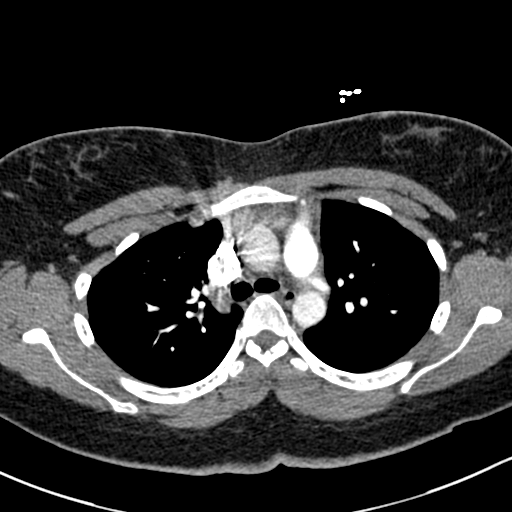
[im 159/221  lung]
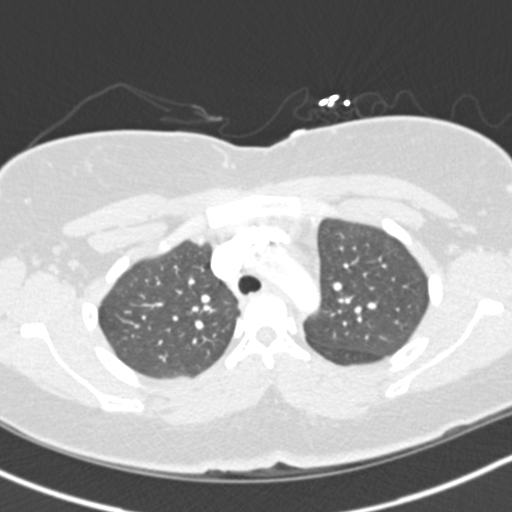
[im 172/221  mediastinal]
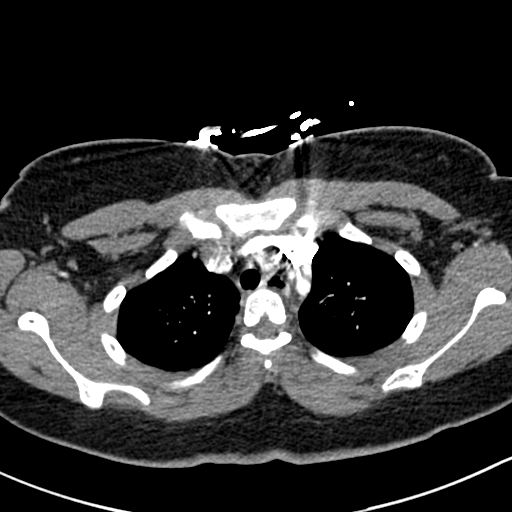
[im 184/221  lung]
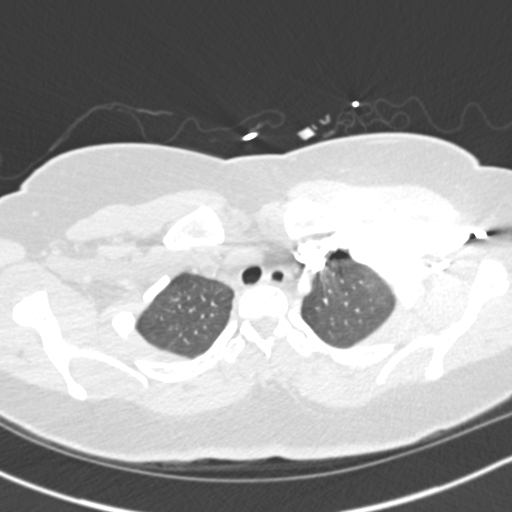
[im 196/221  mediastinal]
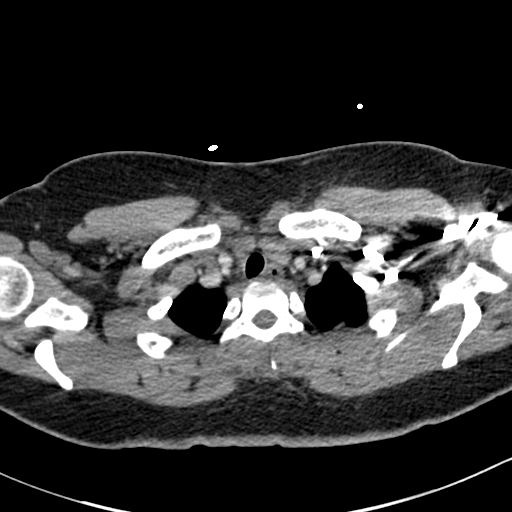
[im 208/221  lung]
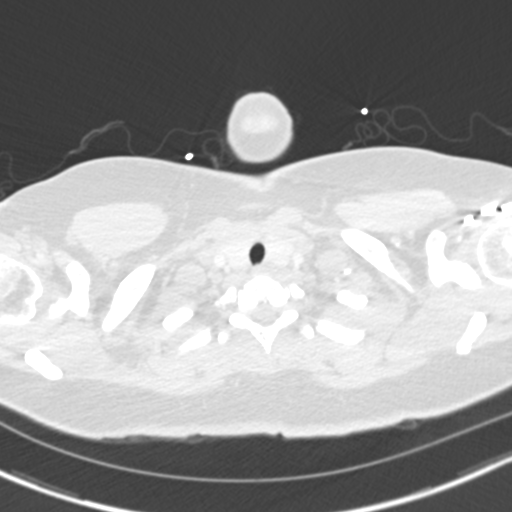

[Series 8: coronal mpr · coronal · 0.45mm/px · 1 of 116 slices shown]
[im 58/116  mediastinal]
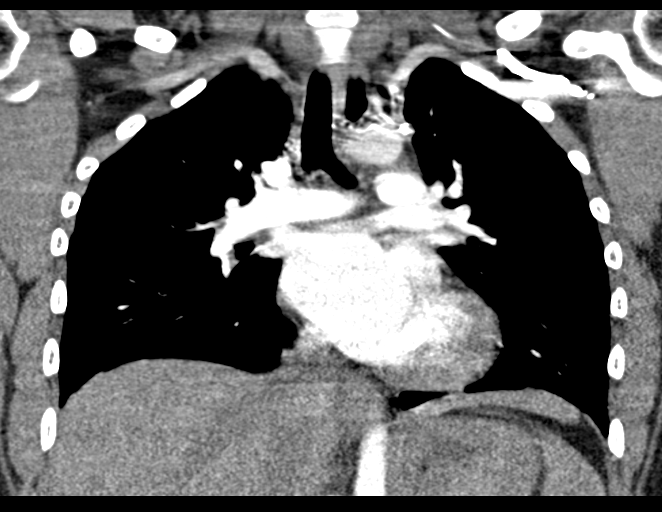

[18 of 36 positions shown; findings below may reference images not displayed]

FINDINGS: Cardiovascular: Satisfactory opacification of the pulmonary arteries
to the segmental level. No evidence of pulmonary embolism. Normal
heart size. No pericardial effusion.

Mediastinum/Nodes: No enlarged mediastinal, hilar, or axillary lymph
nodes. Thyroid gland, trachea, and esophagus demonstrate no
significant findings.

Lungs/Pleura: Lungs are clear. No pleural effusion or pneumothorax.

Upper Abdomen: No acute abnormality.

Musculoskeletal: No chest wall abnormality. No acute or significant
osseous findings.

Review of the MIP images confirms the above findings.
IMPRESSION: Negative.  No CT evidence for acute pulmonary embolus.

## 2022-03-25 ENCOUNTER — Encounter (INDEPENDENT_AMBULATORY_CARE_PROVIDER_SITE_OTHER): Payer: Self-pay

## 2023-04-23 ENCOUNTER — Other Ambulatory Visit (HOSPITAL_COMMUNITY)
Admission: RE | Admit: 2023-04-23 | Discharge: 2023-04-23 | Disposition: A | Discharge status: Home / Self Care | Payer: Managed Care, Other (non HMO) | Source: Ambulatory Visit | Attending: Obstetrics and Gynecology | Admitting: Obstetrics and Gynecology

## 2023-04-23 DIAGNOSIS — Z01419 Encounter for gynecological examination (general) (routine) without abnormal findings: Secondary | ICD-10-CM | POA: Diagnosis present

## 2023-04-26 LAB — CYTOLOGY - PAP
Diagnosis: NEGATIVE
Diagnosis: REACTIVE

## 2024-05-18 ENCOUNTER — Other Ambulatory Visit (HOSPITAL_COMMUNITY)
Admission: RE | Admit: 2024-05-18 | Discharge: 2024-05-18 | Disposition: A | Discharge status: Home / Self Care | Source: Ambulatory Visit | Attending: Obstetrics and Gynecology | Admitting: Obstetrics and Gynecology

## 2024-05-18 DIAGNOSIS — Z01419 Encounter for gynecological examination (general) (routine) without abnormal findings: Secondary | ICD-10-CM | POA: Diagnosis present

## 2024-05-18 DIAGNOSIS — Z1151 Encounter for screening for human papillomavirus (HPV): Secondary | ICD-10-CM | POA: Diagnosis not present

## 2024-05-23 LAB — CYTOLOGY - PAP
Comment: NEGATIVE
Diagnosis: UNDETERMINED — AB
High risk HPV: POSITIVE — AB
